# Patient Record
Sex: Female | Born: 1975 | Race: Black or African American | Hispanic: No | Marital: Single | Smoking: Former smoker
Health system: Southern US, Community
[De-identification: ages and names within clinical notes are randomized; demographics above are authoritative.]

## PROBLEM LIST (undated history)

## (undated) DIAGNOSIS — I10 Essential (primary) hypertension: Secondary | ICD-10-CM

## (undated) DIAGNOSIS — D649 Anemia, unspecified: Secondary | ICD-10-CM

## (undated) DIAGNOSIS — J020 Streptococcal pharyngitis: Secondary | ICD-10-CM

## (undated) HISTORY — DX: Essential (primary) hypertension: I10

## (undated) HISTORY — PX: APPENDECTOMY: SHX54

## (undated) HISTORY — PX: ABDOMINAL HYSTERECTOMY: SHX81

---

## 1997-10-27 ENCOUNTER — Emergency Department (HOSPITAL_COMMUNITY): Admission: EM | Admit: 1997-10-27 | Discharge: 1997-10-27 | Payer: Self-pay | Admitting: Internal Medicine

## 1997-12-23 ENCOUNTER — Emergency Department (HOSPITAL_COMMUNITY): Admission: EM | Admit: 1997-12-23 | Discharge: 1997-12-23 | Payer: Self-pay | Admitting: Emergency Medicine

## 1998-02-28 ENCOUNTER — Emergency Department (HOSPITAL_COMMUNITY): Admission: EM | Admit: 1998-02-28 | Discharge: 1998-02-28 | Payer: Self-pay | Admitting: Emergency Medicine

## 1998-02-28 ENCOUNTER — Encounter: Payer: Self-pay | Admitting: Emergency Medicine

## 1998-11-30 ENCOUNTER — Emergency Department (HOSPITAL_COMMUNITY): Admission: EM | Admit: 1998-11-30 | Discharge: 1998-11-30 | Payer: Self-pay | Admitting: Emergency Medicine

## 1999-04-07 ENCOUNTER — Emergency Department (HOSPITAL_COMMUNITY): Admission: EM | Admit: 1999-04-07 | Discharge: 1999-04-07 | Payer: Self-pay | Admitting: *Deleted

## 2000-01-11 ENCOUNTER — Emergency Department (HOSPITAL_COMMUNITY): Admission: EM | Admit: 2000-01-11 | Discharge: 2000-01-11 | Payer: Self-pay | Admitting: Emergency Medicine

## 2000-08-08 ENCOUNTER — Emergency Department (HOSPITAL_COMMUNITY): Admission: EM | Admit: 2000-08-08 | Discharge: 2000-08-08 | Payer: Self-pay | Admitting: *Deleted

## 2000-09-30 ENCOUNTER — Emergency Department (HOSPITAL_COMMUNITY): Admission: EM | Admit: 2000-09-30 | Discharge: 2000-09-30 | Payer: Self-pay | Admitting: Emergency Medicine

## 2000-10-10 ENCOUNTER — Emergency Department (HOSPITAL_COMMUNITY): Admission: EM | Admit: 2000-10-10 | Discharge: 2000-10-10 | Payer: Self-pay | Admitting: Emergency Medicine

## 2001-05-30 ENCOUNTER — Other Ambulatory Visit: Admission: RE | Admit: 2001-05-30 | Discharge: 2001-05-30 | Payer: Self-pay | Admitting: Obstetrics and Gynecology

## 2004-08-02 ENCOUNTER — Emergency Department (HOSPITAL_COMMUNITY): Admission: EM | Admit: 2004-08-02 | Discharge: 2004-08-02 | Payer: Self-pay | Admitting: Family Medicine

## 2009-05-06 ENCOUNTER — Emergency Department (HOSPITAL_COMMUNITY): Admission: EM | Admit: 2009-05-06 | Discharge: 2009-05-06 | Payer: Self-pay | Admitting: Emergency Medicine

## 2010-06-16 LAB — POCT RAPID STREP A (OFFICE): Streptococcus, Group A Screen (Direct): NEGATIVE

## 2010-10-15 ENCOUNTER — Inpatient Hospital Stay (INDEPENDENT_AMBULATORY_CARE_PROVIDER_SITE_OTHER)
Admission: RE | Admit: 2010-10-15 | Discharge: 2010-10-15 | Disposition: A | Discharge status: Home / Self Care | Payer: Self-pay | Source: Ambulatory Visit | Attending: Emergency Medicine | Admitting: Emergency Medicine

## 2010-10-15 ENCOUNTER — Ambulatory Visit (INDEPENDENT_AMBULATORY_CARE_PROVIDER_SITE_OTHER): Payer: Self-pay

## 2010-10-15 DIAGNOSIS — IMO0002 Reserved for concepts with insufficient information to code with codable children: Secondary | ICD-10-CM

## 2010-10-15 DIAGNOSIS — M799 Soft tissue disorder, unspecified: Secondary | ICD-10-CM

## 2011-05-26 ENCOUNTER — Encounter (HOSPITAL_COMMUNITY): Payer: Self-pay | Admitting: *Deleted

## 2011-05-26 ENCOUNTER — Emergency Department (INDEPENDENT_AMBULATORY_CARE_PROVIDER_SITE_OTHER)
Admission: EM | Admit: 2011-05-26 | Discharge: 2011-05-26 | Disposition: A | Discharge status: Home / Self Care | Payer: BC Managed Care – PPO | Source: Home / Self Care | Attending: Emergency Medicine | Admitting: Emergency Medicine

## 2011-05-26 DIAGNOSIS — J111 Influenza due to unidentified influenza virus with other respiratory manifestations: Secondary | ICD-10-CM

## 2011-05-26 MED ORDER — GUAIFENESIN-CODEINE 100-10 MG/5ML PO SYRP: 10.0000 mL | ORAL_SOLUTION | Freq: Four times a day (QID) | ORAL | Status: AC | PRN

## 2011-05-26 MED ORDER — OSELTAMIVIR PHOSPHATE 75 MG PO CAPS: 75.0000 mg | ORAL_CAPSULE | Freq: Two times a day (BID) | ORAL | Status: AC

## 2011-05-26 NOTE — ED Notes (Signed)
C/o cough that causes chest to hurt. C/o nasal/chest congestion, sore throat since Tuesday 2/26

## 2011-05-26 NOTE — Discharge Instructions (Signed)
Influenza Facts Flu (influenza) is a contagious respiratory illness caused by the influenza viruses. It can cause mild to severe illness. While most healthy people recover from the flu without specific treatment and without complications, older people, young children, and people with certain health conditions are at higher risk for serious complications from the flu, including death. CAUSES   The flu virus is spread from person to person by respiratory droplets from coughing and sneezing.   A person can also become infected by touching an object or surface with a virus on it and then touching their mouth, eye or nose.   Adults may be able to infect others from 1 day before symptoms occur and up to 7 days after getting sick. So it is possible to give someone the flu even before you know you are sick and continue to infect others while you are sick.  SYMPTOMS   Fever (usually high).   Headache.   Tiredness (can be extreme).   Cough.   Sore throat.   Runny or stuffy nose.   Body aches.   Diarrhea and vomiting may also occur, particularly in children.   These symptoms are referred to as "flu-like symptoms". A lot of different illnesses, including the common cold, can have similar symptoms.  DIAGNOSIS   There are tests that can determine if you have the flu as long you are tested within the first 2 or 3 days of illness.   A doctor's exam and additional tests may be needed to identify if you have a disease that is a complicating the flu.  RISKS AND COMPLICATIONS  Some of the complications caused by the flu include:  Bacterial pneumonia or progressive pneumonia caused by the flu virus.   Loss of body fluids (dehydration).   Worsening of chronic medical conditions, such as heart failure, asthma, or diabetes.   Sinus problems and ear infections.  HOME CARE INSTRUCTIONS   Seek medical care early on.   If you are at high risk from complications of the flu, consult your health-care  provider as soon as you develop flu-like symptoms. Those at high risk for complications include:   People 65 years or older.   People with chronic medical conditions, including diabetes.   Pregnant women.   Young children.   Your caregiver may recommend use of an antiviral medication to help treat the flu.   If you get the flu, get plenty of rest, drink a lot of liquids, and avoid using alcohol and tobacco.   You can take over-the-counter medications to relieve the symptoms of the flu if your caregiver approves. (Never give aspirin to children or teenagers who have flu-like symptoms, particularly fever).  PREVENTION  The single best way to prevent the flu is to get a flu vaccine each fall. Other measures that can help protect against the flu are:  Antiviral Medications   A number of antiviral drugs are approved for use in preventing the flu. These are prescription medications, and a doctor should be consulted before they are used.   Habits for Good Health   Cover your nose and mouth with a tissue when you cough or sneeze, throw the tissue away after you use it.   Wash your hands often with soap and water, especially after you cough or sneeze. If you are not near water, use an alcohol-based hand cleaner.   Avoid people who are sick.   If you get the flu, stay home from work or school. Avoid contact with   other people so that you do not make them sick, too.   Try not to touch your eyes, nose, or mouth as germs ore often spread this way.  IN CHILDREN, EMERGENCY WARNING SIGNS THAT NEED URGENT MEDICAL ATTENTION:  Fast breathing or trouble breathing.   Bluish skin color.   Not drinking enough fluids.   Not waking up or not interacting.   Being so irritable that the child does not want to be held.   Flu-like symptoms improve but then return with fever and worse cough.   Fever with a rash.  IN ADULTS, EMERGENCY WARNING SIGNS THAT NEED URGENT MEDICAL ATTENTION:  Difficulty  breathing or shortness of breath.   Pain or pressure in the chest or abdomen.   Sudden dizziness.   Confusion.   Severe or persistent vomiting.  SEEK IMMEDIATE MEDICAL CARE IF:  You or someone you know is experiencing any of the symptoms above. When you arrive at the emergency center,report that you think you have the flu. You may be asked to wear a mask and/or sit in a secluded area to protect others from getting sick. MAKE SURE YOU:   Understand these instructions.   Monitor your condition.   Seek medical care if you are getting worse, or not improving.  Document Released: 03/17/2003 Document Revised: 11/24/2010 Document Reviewed: 12/11/2008 Exit[employer] Patient Information 2012 [employer].   Most upper respiratory infections are caused by viruses and do not require antibiotics.  We try to save the antibiotics for when we really need them to avoid resistance.  This does not mean that there is nothing that can be done.  Here are a few hints about things that can be done at home to get over an upper respiratory infection quicker:  Get extra sleep and extra fluids.  Get 7 to 9 hours of sleep per night and 6 to 8 glasses of water a day.  Getting extra sleep keeps the immune system from getting run down.  Most people with an upper respiratory infection are a little dehydrated.  The extra fluids also keep the secretions liquified and easier to deal with.  Also, get extra vitamin C.  4000 mg per day is the recommended dose. For the aches, headache, and fever, acetaminophen or ibuprofen are helpful.  These can be alternated every 4 hours.  People with liver disease should avoid large amounts of acetaminophen, and people with ulcer disease, gastroesophageal reflux, gastritis, congestive heart failure, chronic kidney disease, coronary artery disease and the elderly should avoid ibuprofen. For nasal congestion try Mucinex-D, or if you're having lots of sneezing or copious clear nasal drainage  Allegra-D-24 hour.  A Saline nasal spray such as Ocean Spray can also help as can decongestant sprays such as Afrin, but you should not use the decongestant sprays for more than 3 or 4 days since they can be habituating.  If nasal dryness is a problem, Ayr Nasal Gel can help moisturize your nasal passages.  Breath Rite nasal strips can also offer a non-drug alternative treatment to nasal congestion, especially at night. For people with symptoms of sinusitis, sleeping with your head elevated can be helpful.  For sinus pain, moist, hot compresses to the face may provide some relief.  Many people find that inhaling steam as in a shower or from a pot of steaming water can help. For sore throat, zinc containing lozenges such as Cold-Eze or Zicam are helpful.  Zinc helps to fight infection and has a mild astringent effect that   relieves the sore, achey throat.  Hot salt water gargles (8 oz of hot water, 1/2 tsp of table salt, and a pinch of baking soda) can give relief as well as hot beverages such as hot tea. For the cough, old time remedies such as honey or honey and lemon are tried and true.  Over the counter cough syrups such as Delsym 2 tsp every 12 hours can help as well.  It's important when you have an upper respiratory infection not to pass the infection to others.  This involves being very careful about the following:  Frequent hand washing or use of hand sanitizer, especially after coughing, sneezing, blowing your nose or touching your face, nose or eyes. Do not shake hands or touch anyone and try to avoid touching surfaces that other people use such as doorknobs, shopping carts, telephones and computer keyboards. Use tissues and dispose of them properly in a garbage can or ziplock bag. Cough into your sleeve. Do not let others eat or drink after you.  It's also important to recognize the signs of serious illness and get evaluated if they occur: Any respiratory infection that lasts more than 7 to  10 days.  Yellow nasal drainage and sputum are not reliable indicators of a bacterial infection, but if they last for more than 1 week, see your doctor. Fever and sore throat can indicate strep. Fever and cough can indicate influenza or pneumonia. Any kind of severe symptom such as difficulty breathing, intractable vomiting, or severe pain should prompt you to see a doctor as soon as possible.   Your body's immune system is really the thing that will get rid of this infection.  Your immune system is comprised of 2 types of specialized cells called T cells and B cells.  T cells coordinate the array of cells in your body that engulf invading bacteria or viruses while B cells orchestrate the production of antibodies that neutralize infection.  Anything we do or any medications we give you, will just strengthen your immune system or help it clear up the infection quicker.  Here are a few helpful hints to improve your immune system to help overcome this illness or to prevent future infections:  A few vitamins can improve the health of your immune system.  That's why your diet should include plenty of fruits, vegetables, fish, nuts, and whole grains.  Vitamin A and bet-carotene can increase the cells that fight infections (T cells and B cells).  Vitamin A is abundant in dark greens and orange vegetables such as spinach, greens, sweet potatoes, and carrots.  Vitamin B6 contributes to the maturation of white blood cells, the cells that fight disease.  Foods with vitamin B6 include cold cereal and bananas.  Vitamin C is credited with preventing colds because it increases white blood cells and also prevents cellular damage.  Citrus fruits, peaches and green and red bell peppers are all hight in vitamin C.  Vitamin E is an anti-oxidant that encourages the production of natural killer cells which reject foreign invaders and B cells that produce antibodies.  Foods high in vitamin E include wheat germ, nuts and  seeds.  Foods high in omega-3 fatty acids found in foods like salmon, tuna and mackerel boost your immune system and help cells to engulf and absorb germs.  Probiotics are good bacteria that increase your T cells.  These can be found in yogurt and are available in supplements such as Culturelle or Align.  Moderate exercise increases the   strength of your immune system and your ability to recover from illness.  I suggest 3 to 5 moderate intensity 30 minute workouts per week.    Sleep is another component of maintaining a strong immune system.  It enables your body to recuperate from the day's activities, stress and work.  My recommendation is to get between 7 and 9 hours of sleep per night.  If you smoke, try to quit completely or at least cut down.  Drink alcohol only in moderation if at all.  No more than 2 drinks daily for men or 1 for women.  Get a flu vaccine early in the fall or if you have not gotten one yet, once this illness has run its course.  If you are over 65, a smoker, or an asthmatic, get a pneumococcal vaccine.  My final recommendation is to maintain a healthy weight.  Excess weight can impair the immune system by interfering with the way the immune system deals with invading viruses or bacteria.   

## 2011-05-26 NOTE — ED Provider Notes (Signed)
Chief Complaint  Patient presents with  . Cough    History of Present Illness:   The patient has had a three-day history of sore throat, cough productive of small amounts of clear sputum, chest tightness, nasal congestion, rhinorrhea, sneezing, headache, fever up to 101, chills, sweats, muscle aches, and aching in the chest when she coughs. 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 She has had a flu vaccine this year.  Review of Systems:  Other than noted above, the patient denies any of the following symptoms. Systemic:  No fever, chills, sweats, fatigue, myalgias, headache, or anorexia. Eye:  No redness, pain or drainage. ENT:  No earache, nasal congestion, rhinorrhea, sinus pressure, or sore throat. Lungs:  No cough, sputum production, wheezing, shortness of breath. Or chest pain. GI:  No nausea, vomiting, abdominal pain or diarrhea. Skin:  No rash or itching.  PMFSH:  Past medical history, family history, social history, meds, and allergies were reviewed.  Physical Exam:   Vital signs:  BP 140/91  Pulse 108  Temp(Src) 98.4 F (36.9 C) (Oral)  Resp 16  SpO2 98%  LMP 05/14/2011 General:  Alert, in no distress. Eye:  No conjunctival injection or drainage. ENT:  TMs and canals were normal, without erythema or inflammation.  Nasal mucosa was clear and uncongested, without drainage.  Mucous membranes were moist.  Pharynx was clear, without exudate or drainage.  There were no oral ulcerations or lesions. Neck:  Supple, no adenopathy, tenderness or mass. Lungs:  No respiratory distress.  Lungs were clear to auscultation, without wheezes, rales or rhonchi.  Breath sounds were clear and equal bilaterally. Heart:  Regular rhythm, without gallops, murmers or rubs. Skin:  Clear, warm, and dry, without rash or lesions.  Assessment:   Diagnoses that have been ruled out:  None  Diagnoses that are still under consideration:  None  Final diagnoses:  Influenza-like illness     Plan:   1.  The following meds were prescribed:   New Prescriptions   GUAIFENESIN-CODEINE (GUIATUSS AC) 100-10 MG/5ML SYRUP    Take 10 mLs by mouth 4 (four) times daily as needed for cough.   OSELTAMIVIR (TAMIFLU) 75 MG CAPSULE    Take 1 capsule (75 mg total) by mouth every 12 (twelve) hours.   2.  The patient was instructed in symptomatic care and handouts were given. 3.  The patient was told to return if becoming worse in any way, if no better in 3 or 4 days, and given some red flag symptoms that would indicate earlier return.   Roque Lias, MD 05/26/11 [phone]

## 2013-03-01 ENCOUNTER — Encounter (HOSPITAL_COMMUNITY): Payer: Self-pay | Admitting: Emergency Medicine

## 2013-03-01 ENCOUNTER — Emergency Department (HOSPITAL_COMMUNITY)
Admission: EM | Admit: 2013-03-01 | Discharge: 2013-03-01 | Disposition: A | Discharge status: Home / Self Care | Payer: BC Managed Care – PPO | Source: Home / Self Care | Attending: Family Medicine | Admitting: Family Medicine

## 2013-03-01 DIAGNOSIS — J111 Influenza due to unidentified influenza virus with other respiratory manifestations: Secondary | ICD-10-CM

## 2013-03-01 NOTE — ED Notes (Signed)
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

## 2013-03-01 NOTE — ED Provider Notes (Signed)
CSN: [id number]     Arrival date & time 03/01/13  1232 History   First MD Initiated Contact with Patient 03/01/13 1324     Chief Complaint  Patient presents with  . Fever   (Consider location/radiation/quality/duration/timing/severity/associated sxs/prior Treatment) Patient is a 37 y.o. female presenting with fever. The history is provided by the patient.  Fever Temp source:  Oral Severity:  Moderate Onset quality:  Sudden Duration:  1 day Progression:  Unchanged Chronicity:  New Worsened by:  Nothing tried Ineffective treatments:  Acetaminophen and ibuprofen Associated symptoms: chills, cough, myalgias and rhinorrhea   Associated symptoms: no diarrhea, no dysuria, no nausea and no vomiting   Risk factors: sick contacts     History reviewed. No pertinent past medical history. History reviewed. No pertinent past surgical history. Family History  Problem Relation Age of Onset  . Diabetes Mother    History  Substance Use Topics  . Smoking status: Never Smoker   . Smokeless tobacco: Not on file  . Alcohol Use: Yes   OB History   Grav Para Term Preterm Abortions TAB SAB Ect Mult Living   1 1 1             Review of Systems  Constitutional: Positive for fever and chills.  HENT: Positive for rhinorrhea.   Respiratory: Positive for cough.   Gastrointestinal: Negative.  Negative for nausea, vomiting and diarrhea.  Genitourinary: Negative.  Negative for dysuria.  Musculoskeletal: Positive for myalgias.    Allergies  Review of patient's allergies indicates no known allergies.  Home Medications  No current outpatient prescriptions on file. BP 136/90  Pulse 103  Temp(Src) 100.9 F (38.3 C) (Oral)  Resp 16  SpO2 97% Physical Exam  Nursing note and vitals reviewed. Constitutional: She is oriented to person, place, and time. She appears well-developed and well-nourished.  HENT:  Head: Normocephalic.  Right Ear: External ear normal.  Left Ear: External ear normal.   Mouth/Throat: Oropharynx is clear and moist.  Eyes: Pupils are equal, round, and reactive to light.  Neck: Normal range of motion. Neck supple.  Cardiovascular: Regular rhythm and normal heart sounds.   Pulmonary/Chest: Breath sounds normal.  Abdominal: Bowel sounds are normal. There is no tenderness.  Lymphadenopathy:    She has no cervical adenopathy.  Neurological: She is alert and oriented to person, place, and time.  Skin: Skin is warm and dry.    ED Course  Procedures (including critical care time) Labs Review Labs Reviewed - No data to display Imaging Review No results found.  EKG Interpretation    Date/Time:    Ventricular Rate:    PR Interval:    QRS Duration:   QT Interval:    QTC Calculation:   R Axis:     Text Interpretation:              MDM      Linna Hoff, MD 03/01/13 1406

## 2014-01-27 ENCOUNTER — Encounter (HOSPITAL_COMMUNITY): Payer: Self-pay | Admitting: Emergency Medicine

## 2014-08-03 ENCOUNTER — Emergency Department (HOSPITAL_COMMUNITY)
Admission: EM | Admit: 2014-08-03 | Discharge: 2014-08-03 | Disposition: A | Discharge status: Home / Self Care | Payer: 59 | Attending: Emergency Medicine | Admitting: Emergency Medicine

## 2014-08-03 ENCOUNTER — Encounter (HOSPITAL_COMMUNITY): Payer: Self-pay | Admitting: Emergency Medicine

## 2014-08-03 DIAGNOSIS — R0981 Nasal congestion: Secondary | ICD-10-CM | POA: Diagnosis present

## 2014-08-03 DIAGNOSIS — J029 Acute pharyngitis, unspecified: Secondary | ICD-10-CM

## 2014-08-03 DIAGNOSIS — J069 Acute upper respiratory infection, unspecified: Secondary | ICD-10-CM | POA: Diagnosis not present

## 2014-08-03 DIAGNOSIS — R590 Localized enlarged lymph nodes: Secondary | ICD-10-CM | POA: Insufficient documentation

## 2014-08-03 DIAGNOSIS — J302 Other seasonal allergic rhinitis: Secondary | ICD-10-CM | POA: Insufficient documentation

## 2014-08-03 HISTORY — DX: Streptococcal pharyngitis: J02.0

## 2014-08-03 LAB — RAPID STREP SCREEN (MED CTR MEBANE ONLY): Streptococcus, Group A Screen (Direct): NEGATIVE

## 2014-08-03 MED ORDER — PHENOL 1.4 % MT LIQD
1.0000 | OROMUCOSAL | Status: DC | PRN
  Administered 2014-08-03: 1 via OROMUCOSAL
  Filled 2014-08-03: qty 177

## 2014-08-03 NOTE — ED Provider Notes (Signed)
CSN: [id number]     Arrival date & time 08/03/14  1129 History  This chart was scribed for [employer] PA-C working with Serita Grit, MD by Mercy Moore, ED Scribe. This patient was seen in room TR08C/TR08C and the patient's care was started at 11:52 AM.   No chief complaint on file.  Patient is a 39 y.o. female presenting with pharyngitis. The history is provided by the patient. No language interpreter was used.  Sore Throat This is a new problem. The current episode started 3 to 5 hours ago. The problem occurs constantly. The problem has been gradually worsening. Pertinent negatives include no chest pain, no abdominal pain, no headaches and no shortness of breath. The symptoms are aggravated by swallowing. Nothing relieves the symptoms. She has tried nothing for the symptoms. The treatment provided no relief.   HPI Comments: Toni Valencia is a 39 y.o. female who presents to the Emergency Department complaining of 8/10, constant nonradiating sore throat, onset this morning. Patient reports worsening sore throat with spitting and swallowing. 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 No known alleviating factors give that she has not tried anything PTA. Associated symptoms include dry cough, nasal congestion, and clear rhinorrhea. Patient denies difficulty swallowing, trismus, drooling, neck pain/swelling, arthralgias, myalgias, fever, chills, ear pain or drainage, CP, SOB, wheezing, abdominal pain, nausea/vomiting, diarrhea, weakness, tingling or numbness. ?Environmental allergies. No other medical issues.  No past medical history on file. No past surgical history on file. Family History  Problem Relation Age of Onset  . Diabetes Mother    History  Substance Use Topics  . Smoking status: Never Smoker   . Smokeless tobacco: Not on file  . Alcohol Use: Yes   OB History    Gravida Para Term Preterm AB TAB SAB Ectopic Multiple Living   1 1 1              Review of Systems  Constitutional: Negative for fever and chills.  HENT: Positive for congestion, rhinorrhea, sinus pressure and sore throat. Negative for drooling, ear discharge, ear pain and trouble swallowing.   Eyes: Negative for visual disturbance.  Respiratory: Positive for cough. Negative for shortness of breath and wheezing.   Cardiovascular: Negative for chest pain.  Gastrointestinal: Negative for nausea, vomiting, abdominal pain and diarrhea.  Genitourinary: Negative for dysuria and hematuria.  Musculoskeletal: Negative for myalgias, arthralgias and neck pain.  Skin: Negative for rash.  Allergic/Immunologic: Positive for environmental allergies (she thinks). Negative for immunocompromised state.  Neurological: Negative for weakness, numbness and headaches.  A complete 10 system review of systems was obtained and all systems are negative except as noted in the HPI and PMH.    Allergies  Review of patient's allergies indicates no known allergies.  Home Medications   Prior to Admission medications   Not on File   Triage Vitals:  BP 128/80 mmHg  Pulse 96  Temp(Src) 99 F (37.2 C) (Oral)  Resp 14  Ht 5\' 9"  (1.753 m)  Wt 150 lb (68.04 kg)  BMI 22.14 kg/m2  SpO2 100%  LMP 07/20/2014 Physical Exam  Constitutional: She is oriented to person, place, and time. Vital signs are normal. She appears well-developed and well-nourished.  Non-toxic appearance. No distress.  Afebrile, nontoxic, NAD  HENT:  Head: Normocephalic and atraumatic.  Right Ear: Hearing, tympanic membrane, external ear and ear canal normal.  Left Ear: Hearing, tympanic membrane, external ear and ear canal normal.  Nose: Mucosal edema  and rhinorrhea present.  Mouth/Throat: Uvula is midline and mucous membranes are normal. No trismus in the jaw. No uvula swelling. Posterior oropharyngeal erythema present. No oropharyngeal exudate, posterior oropharyngeal edema or tonsillar abscesses.  Left nostril  mucosal edema. Tender lymphadenopathy cervical and tonsillar bilaterally. Ears clear bilaterally. Mild bilateral nasal mucosal edema with L>R, and clear rhinorrhea. Uvula midline with mild erythema, but no swelling or deviation. No trismus or drooling. No tonsillar swelling or exudates. Mild oropharyngeal erythema.   Eyes: Conjunctivae and EOM are normal. Right eye exhibits no discharge. Left eye exhibits no discharge.  Neck: Normal range of motion. Neck supple.  Cardiovascular: Normal rate, regular rhythm, normal heart sounds and intact distal pulses.  Exam reveals no gallop and no friction rub.   No murmur heard. Pulmonary/Chest: Effort normal and breath sounds normal. No respiratory distress. She has no decreased breath sounds. She has no wheezes. She has no rhonchi. She has no rales.  CTAB in all lung fields, no w/r/r, no hypoxia or increased WOB, speaking in full sentences, SpO2 100% on RA   Abdominal: Soft. Normal appearance and bowel sounds are normal. She exhibits no distension. There is no tenderness. There is no rigidity, no rebound and no guarding.  Musculoskeletal: Normal range of motion.  Lymphadenopathy:       Head (right side): Tonsillar adenopathy present. No submandibular adenopathy present.       Head (left side): Tonsillar adenopathy present. No submandibular adenopathy present.    She has cervical adenopathy.  Shotty cervical LAD bilaterally and tonsillar LAD bilaterally with mild TTP.   Neurological: She is alert and oriented to person, place, and time. She has normal strength. No sensory deficit.  Skin: Skin is warm, dry and intact. No rash noted.  Psychiatric: She has a normal mood and affect. Her behavior is normal.  Nursing note and vitals reviewed.   ED Course  Procedures (including critical care time)  COORDINATION OF CARE: 11:35 AM- Discussed treatment plan with patient at bedside and patient agreed to plan.   Labs Review Labs Reviewed  RAPID STREP SCREEN   CULTURE, GROUP A STREP    Imaging Review No results found.   EKG Interpretation None      MDM   Final diagnoses:  Sore throat  URI (upper respiratory infection)    39 y.o. female here with sore throat and URI symptoms. Clear lung exam. +sick contacts. Although unlikely since CENTOR criteria is low, will obtain RST. Will give chloraseptic spray here for relief. Will reassess shortly  12:59 PM RST neg. Will have culture results dictate any further needs for treatment. Discussed symptomatic control and f/up with PCP (calling ins company for this information) in 1wk. Strict return precautions given. I explained the diagnosis and have given explicit precautions to return to the ER including for any other new or worsening symptoms. The patient understands and accepts the medical plan as it's been dictated and I have answered their questions. Discharge instructions concerning home care and prescriptions have been given. The patient is STABLE and is discharged to home in good condition.   I personally performed the services described in this documentation, which was scribed in my presence. The recorded information has been reviewed and is accurate.  BP 128/80 mmHg  Pulse 96  Temp(Src) 99 F (37.2 C) (Oral)  Resp 14  Ht 5\' 9"  (1.753 m)  Wt 150 lb (68.04 kg)  BMI 22.14 kg/m2  SpO2 100%  LMP 07/20/2014  Meds ordered this encounter  Medications  . phenol (CHLORASEPTIC) mouth spray 1 spray    Sig:       Marshelle Bilger Camprubi-Soms, PA-C 08/03/14 1303  Lucresia Simic Camprubi-Soms, PA-C 08/03/14 [place], MD 08/07/14 [phone]

## 2014-08-03 NOTE — Discharge Instructions (Signed)
Continue to stay well-hydrated. Gargle warm salt water and spit it out. Use chloraseptic spray for throat pain. Continue to alternate between Tylenol and Ibuprofen for pain or fever. Use Mucinex for cough suppression/expectoration of mucus. Use netipot and flonase to help with nasal congestion. May consider over-the-counter Benadryl or other antihistamine to decrease secretions and for watery itchy eyes. Followup with your primary care doctor in 5-7 days for recheck of ongoing symptoms. Return to emergency department for emergent changing or worsening of symptoms.   Cough, Adult  A cough is a reflex that helps clear your throat and airways. It can help heal the body or may be a reaction to an irritated airway. A cough may only last 2 or 3 weeks (acute) or may last more than 8 weeks (chronic).  CAUSES Acute cough:  Viral or bacterial infections. Chronic cough:  Infections.  Allergies.  Asthma.  Post-nasal drip.  Smoking.  Heartburn or acid reflux.  Some medicines.  Chronic lung problems (COPD).  Cancer. SYMPTOMS   Cough.  Fever.  Chest pain.  Increased breathing rate.  High-pitched whistling sound when breathing (wheezing).  Colored mucus that you cough up (sputum). TREATMENT   A bacterial cough may be treated with antibiotic medicine.  A viral cough must run its course and will not respond to antibiotics.  Your caregiver may recommend other treatments if you have a chronic cough. HOME CARE INSTRUCTIONS   Only take over-the-counter or prescription medicines for pain, discomfort, or fever as directed by your caregiver. Use cough suppressants only as directed by your caregiver.  Use a cold steam vaporizer or humidifier in your bedroom or home to help loosen secretions.  Sleep in a semi-upright position if your cough is worse at night.  Rest as needed.  Stop smoking if you smoke. SEEK IMMEDIATE MEDICAL CARE IF:   You have pus in your sputum.  Your cough  starts to worsen.  You cannot control your cough with suppressants and are losing sleep.  You begin coughing up blood.  You have difficulty breathing.  You develop pain which is getting worse or is uncontrolled with medicine.  You have a fever. MAKE SURE YOU:   Understand these instructions.  Will watch your condition.  Will get help right away if you are not doing well or get worse. Document Released: 09/10/2010 Document Revised: 06/06/2011 Document Reviewed: 09/10/2010 [facility] Patient Information 2015 [place], [place]. This information is not intended to replace advice given to you by your health care provider. Make sure you discuss any questions you have with your health care provider.  Pharyngitis Pharyngitis is a sore throat (pharynx). There is redness, pain, and swelling of your throat. HOME CARE   Drink enough fluids to keep your pee (urine) clear or pale yellow.  Only take medicine as told by your doctor.  You may get sick again if you do not take medicine as told. Finish your medicines, even if you start to feel better.  Do not take aspirin.  Rest.  Rinse your mouth (gargle) with salt water ( tsp of salt per 1 qt of water) every 1-2 hours. This will help the pain.  If you are not at risk for choking, you can suck on hard candy or sore throat lozenges. GET HELP IF:  You have large, tender lumps on your neck.  You have a rash.  You cough up green, yellow-brown, or bloody spit. GET HELP RIGHT AWAY IF:   You have a stiff neck.  You drool  or cannot swallow liquids.  You throw up (vomit) or are not able to keep medicine or liquids down.  You have very bad pain that does not go away with medicine.  You have problems breathing (not from a stuffy nose). MAKE SURE YOU:   Understand these instructions.  Will watch your condition.  Will get help right away if you are not doing well or get worse. Document Released: 08/31/2007 Document Revised: 01/02/2013  Document Reviewed: 11/19/2012 [facility] Patient Information 2015 [place], [place]. This information is not intended to replace advice given to you by your health care provider. Make sure you discuss any questions you have with your health care provider.  Salt Water Gargle This solution will help make your mouth and throat feel better. HOME CARE INSTRUCTIONS   Mix 1 teaspoon of salt in 8 ounces of warm water.  Gargle with this solution as much or often as you need or as directed. Swish and gargle gently if you have any sores or wounds in your mouth.  Do not swallow this mixture. Document Released: 12/17/2003 Document Revised: 06/06/2011 Document Reviewed: 05/09/2008 [facility] Patient Information 2015 [place], [place]. This information is not intended to replace advice given to you by your health care provider. Make sure you discuss any questions you have with your health care provider.  Upper Respiratory Infection, Adult An upper respiratory infection (URI) is also sometimes known as the common cold. The upper respiratory tract includes the nose, sinuses, throat, trachea, and bronchi. Bronchi are the airways leading to the lungs. Most people improve within 1 week, but symptoms can last up to 2 weeks. A residual cough may last even longer.  CAUSES Many different viruses can infect the tissues lining the upper respiratory tract. The tissues become irritated and inflamed and often become very moist. Mucus production is also common. A cold is contagious. You can easily spread the virus to others by oral contact. This includes kissing, sharing a glass, coughing, or sneezing. Touching your mouth or nose and then touching a surface, which is then touched by another person, can also spread the virus. SYMPTOMS  Symptoms typically develop 1 to 3 days after you come in contact with a cold virus. Symptoms vary from person to person. They may include:  Runny nose.  Sneezing.  Nasal congestion.  Sinus  irritation.  Sore throat.  Loss of voice (laryngitis).  Cough.  Fatigue.  Muscle aches.  Loss of appetite.  Headache.  Low-grade fever. DIAGNOSIS  You might diagnose your own cold based on familiar symptoms, since most people get a cold 2 to 3 times a year. Your caregiver can confirm this based on your exam. Most importantly, your caregiver can check that your symptoms are not due to another disease such as strep throat, sinusitis, pneumonia, asthma, or epiglottitis. Blood tests, throat tests, and X-rays are not necessary to diagnose a common cold, but they may sometimes be helpful in excluding other more serious diseases. Your caregiver will decide if any further tests are required. RISKS AND COMPLICATIONS  You may be at risk for a more severe case of the common cold if you smoke cigarettes, have chronic heart disease (such as heart failure) or lung disease (such as asthma), or if you have a weakened immune system. The very young and very old are also at risk for more serious infections. Bacterial sinusitis, middle ear infections, and bacterial pneumonia can complicate the common cold. The common cold can worsen asthma and chronic obstructive pulmonary disease (COPD). Sometimes,  these complications can require emergency medical care and may be life-threatening. PREVENTION  The best way to protect against getting a cold is to practice good hygiene. Avoid oral or hand contact with people with cold symptoms. Wash your hands often if contact occurs. There is no clear evidence that vitamin C, vitamin E, echinacea, or exercise reduces the chance of developing a cold. However, it is always recommended to get plenty of rest and practice good nutrition. TREATMENT  Treatment is directed at relieving symptoms. There is no cure. Antibiotics are not effective, because the infection is caused by a virus, not by bacteria. Treatment may include:  Increased fluid intake. Sports drinks offer valuable  electrolytes, sugars, and fluids.  Breathing heated mist or steam (vaporizer or shower).  Eating chicken soup or other clear broths, and maintaining good nutrition.  Getting plenty of rest.  Using gargles or lozenges for comfort.  Controlling fevers with ibuprofen or acetaminophen as directed by your caregiver.  Increasing usage of your inhaler if you have asthma. Zinc gel and zinc lozenges, taken in the first 24 hours of the common cold, can shorten the duration and lessen the severity of symptoms. Pain medicines may help with fever, muscle aches, and throat pain. A variety of non-prescription medicines are available to treat congestion and runny nose. Your caregiver can make recommendations and may suggest nasal or lung inhalers for other symptoms.  HOME CARE INSTRUCTIONS   Only take over-the-counter or prescription medicines for pain, discomfort, or fever as directed by your caregiver.  Use a warm mist humidifier or inhale steam from a shower to increase air moisture. This may keep secretions moist and make it easier to breathe.  Drink enough water and fluids to keep your urine clear or pale yellow.  Rest as needed.  Return to work when your temperature has returned to normal or as your caregiver advises. You may need to stay home longer to avoid infecting others. You can also use a face mask and careful hand washing to prevent spread of the virus. SEEK MEDICAL CARE IF:   After the first few days, you feel you are getting worse rather than better.  You need your caregiver's advice about medicines to control symptoms.  You develop chills, worsening shortness of breath, or brown or red sputum. These may be signs of pneumonia.  You develop yellow or brown nasal discharge or pain in the face, especially when you bend forward. These may be signs of sinusitis.  You develop a fever, swollen neck glands, pain with swallowing, or white areas in the back of your throat. These may be signs  of strep throat. SEEK IMMEDIATE MEDICAL CARE IF:   You have a fever.  You develop severe or persistent headache, ear pain, sinus pain, or chest pain.  You develop wheezing, a prolonged cough, cough up blood, or have a change in your usual mucus (if you have chronic lung disease).  You develop sore muscles or a stiff neck. Document Released: 09/07/2000 Document Revised: 06/06/2011 Document Reviewed: 06/19/2013 [facility] Patient Information 2015 [place], [place]. This information is not intended to replace advice given to you by your health care provider. Make sure you discuss any questions you have with your health care provider.

## 2014-08-03 NOTE — ED Notes (Signed)
Pt c/o nasal congestion onset Friday and sore throat onset today. Pt has not taken any meds today for symptoms.

## 2014-08-07 LAB — CULTURE, GROUP A STREP: Strep A Culture: NEGATIVE

## 2015-02-20 ENCOUNTER — Encounter (HOSPITAL_COMMUNITY): Payer: Self-pay

## 2015-02-20 ENCOUNTER — Emergency Department (HOSPITAL_COMMUNITY)
Admission: EM | Admit: 2015-02-20 | Discharge: 2015-02-20 | Disposition: A | Discharge status: Home / Self Care | Payer: 59 | Attending: Emergency Medicine | Admitting: Emergency Medicine

## 2015-02-20 DIAGNOSIS — N8189 Other female genital prolapse: Secondary | ICD-10-CM | POA: Insufficient documentation

## 2015-02-20 DIAGNOSIS — R339 Retention of urine, unspecified: Secondary | ICD-10-CM | POA: Insufficient documentation

## 2015-02-20 DIAGNOSIS — Z3202 Encounter for pregnancy test, result negative: Secondary | ICD-10-CM | POA: Diagnosis not present

## 2015-02-20 DIAGNOSIS — R300 Dysuria: Secondary | ICD-10-CM | POA: Diagnosis present

## 2015-02-20 DIAGNOSIS — Z79899 Other long term (current) drug therapy: Secondary | ICD-10-CM | POA: Diagnosis not present

## 2015-02-20 DIAGNOSIS — N819 Female genital prolapse, unspecified: Secondary | ICD-10-CM

## 2015-02-20 DIAGNOSIS — D509 Iron deficiency anemia, unspecified: Secondary | ICD-10-CM | POA: Diagnosis not present

## 2015-02-20 LAB — CBC WITH DIFFERENTIAL/PLATELET
BASOS ABS: 0 10*3/uL (ref 0.0–0.1)
BASOS PCT: 0 %
Eosinophils Absolute: 0 10*3/uL (ref 0.0–0.7)
Eosinophils Relative: 0 %
HCT: 23.7 % — ABNORMAL LOW (ref 36.0–46.0)
HEMOGLOBIN: 6.6 g/dL — AB (ref 12.0–15.0)
LYMPHS ABS: 0.7 10*3/uL (ref 0.7–4.0)
Lymphocytes Relative: 6 %
MCH: 18.6 pg — AB (ref 26.0–34.0)
MCHC: 27.8 g/dL — AB (ref 30.0–36.0)
MCV: 66.8 fL — ABNORMAL LOW (ref 78.0–100.0)
MONO ABS: 0.3 10*3/uL (ref 0.1–1.0)
Monocytes Relative: 3 %
NEUTROS ABS: 10.2 10*3/uL — AB (ref 1.7–7.7)
Neutrophils Relative %: 91 %
PLATELETS: 485 10*3/uL — AB (ref 150–400)
RBC: 3.55 MIL/uL — ABNORMAL LOW (ref 3.87–5.11)
RDW: 18.4 % — AB (ref 11.5–15.5)
WBC: 11.2 10*3/uL — ABNORMAL HIGH (ref 4.0–10.5)

## 2015-02-20 LAB — BASIC METABOLIC PANEL
Anion gap: 10 (ref 5–15)
BUN: 14 mg/dL (ref 6–20)
CALCIUM: 9.5 mg/dL (ref 8.9–10.3)
CHLORIDE: 107 mmol/L (ref 101–111)
CO2: 21 mmol/L — ABNORMAL LOW (ref 22–32)
CREATININE: 0.79 mg/dL (ref 0.44–1.00)
GFR calc Af Amer: >60 mL/min (ref >60)
GFR calc non Af Amer: >60 mL/min (ref >60)
Glucose, Bld: 82 mg/dL (ref 65–99)
Potassium: 4 mmol/L (ref 3.5–5.1)
SODIUM: 138 mmol/L (ref 135–145)

## 2015-02-20 LAB — URINALYSIS, ROUTINE W REFLEX MICROSCOPIC
Bilirubin Urine: NEGATIVE
Glucose, UA: NEGATIVE mg/dL
Hgb urine dipstick: NEGATIVE
KETONES UR: NEGATIVE mg/dL
LEUKOCYTES UA: NEGATIVE
Nitrite: NEGATIVE
Protein, ur: NEGATIVE mg/dL
Specific Gravity, Urine: 1.016 (ref 1.005–1.030)
pH: 7 (ref 5.0–8.0)

## 2015-02-20 LAB — PREGNANCY, URINE: PREG TEST UR: NEGATIVE

## 2015-02-20 LAB — TYPE AND SCREEN
ABO/RH(D): O POS
Antibody Screen: NEGATIVE

## 2015-02-20 LAB — I-STAT CHEM 8, ED
BUN: 11 mg/dL (ref 6–20)
CALCIUM ION: 1.24 mmol/L — AB (ref 1.12–1.23)
CREATININE: 0.6 mg/dL (ref 0.44–1.00)
Chloride: 106 mmol/L (ref 101–111)
Glucose, Bld: 87 mg/dL (ref 65–99)
HCT: 26 % — ABNORMAL LOW (ref 36.0–46.0)
Hemoglobin: 8.8 g/dL — ABNORMAL LOW (ref 12.0–15.0)
Potassium: 4 mmol/L (ref 3.5–5.1)
SODIUM: 139 mmol/L (ref 135–145)
TCO2: 20 mmol/L (ref 0–100)

## 2015-02-20 LAB — ABO/RH: ABO/RH(D): O POS

## 2015-02-20 MED ORDER — FERROUS SULFATE 325 (65 FE) MG PO TABS: 325.0000 mg | ORAL_TABLET | Freq: Every day | ORAL | Status: DC

## 2015-02-20 NOTE — ED Notes (Signed)
Patient reports that she has been having heavy menstrual periods.

## 2015-02-20 NOTE — ED Provider Notes (Signed)
CSN: [id number]     Arrival date & time 02/20/15  1322 History   First MD Initiated Contact with Patient 02/20/15 1533     Chief Complaint  Patient presents with  . Dysuria     (Consider location/radiation/quality/duration/timing/severity/associated sxs/prior Treatment) HPI 39 year old female who presents with urinary retention. History of appendectomy and is otherwise healthy. States that she has been in her usual state of health, and this morning was unable to urinate. Subsequently drink 3 bottles of water, and this had worsening bladder distension and low abdominal pain. Has not had this problem before, Denies dysuria, urinary frequency, fever, chills, N/V, diarrhea, abnormal vaginal discharge or bleeding.    Past Medical History  Diagnosis Date  . Strep throat    Past Surgical History  Procedure Laterality Date  . Appendectomy     Family History  Problem Relation Age of Onset  . Diabetes Mother    Social History  Substance Use Topics  . Smoking status: Never Smoker   . Smokeless tobacco: None  . Alcohol Use: Yes   OB History    Gravida Para Term Preterm AB TAB SAB Ectopic Multiple Living   1 1 1             Review of Systems 10/14 systems reviewed and are negative other than those stated in the HPI    Allergies  Review of patient's allergies indicates no known allergies.  Home Medications   Prior to Admission medications   Medication Sig Start Date End Date Taking? Authorizing Provider  BucAlfAspKGlucCouchParsUvaUrJu (NATURAL WATER PILLS PO) Take 2 tablets by mouth daily.   Yes Historical Provider, MD  ibuprofen (ADVIL,MOTRIN) 200 MG tablet Take 400 mg by mouth every 6 (six) hours as needed for moderate pain.   Yes Historical Provider, MD  ferrous sulfate 325 (65 FE) MG tablet Take 1 tablet (325 mg total) by mouth daily. 02/20/15   Forde Dandy, MD   BP 141/81 mmHg  Pulse 106  Temp(Src) 97.7 F (36.5 C) (Oral)  Resp 20  SpO2 100%  LMP  02/12/2015 Physical Exam Physical Exam  Nursing note and vitals reviewed. Constitutional: Well developed, well nourished, non-toxic, and in no acute distress Head: Normocephalic and atraumatic.  Mouth/Throat: Oropharynx is clear and moist.  Neck: Normal range of motion. Neck supple.  Cardiovascular: Normal rate and regular rhythm.   Pulmonary/Chest: Effort normal and breath sounds normal.  Abdominal: Soft. There is no tenderness. There is no rebound and no guarding.  Musculoskeletal: Normal range of motion.  Neurological: Alert, no facial droop, fluent speech, moves all extremities symmetrically Skin: Skin is warm and dry.  Psychiatric: Cooperative  ED Course  Procedures (including critical care time) Labs Review Labs Reviewed  URINALYSIS, ROUTINE W REFLEX MICROSCOPIC (NOT AT [facility]) - Abnormal; Notable for the following:    APPearance CLOUDY (*)    All other components within normal limits  CBC WITH DIFFERENTIAL/PLATELET - Abnormal; Notable for the following:    WBC 11.2 (*)    RBC 3.55 (*)    Hemoglobin 6.6 (*)    HCT 23.7 (*)    MCV 66.8 (*)    MCH 18.6 (*)    MCHC 27.8 (*)    RDW 18.4 (*)    Platelets 485 (*)    Neutro Abs 10.2 (*)    All other components within normal limits  BASIC METABOLIC PANEL - Abnormal; Notable for the following:    CO2 21 (*)    All other components within normal  limits  I-STAT CHEM 8, ED - Abnormal; Notable for the following:    Calcium, Ion 1.24 (*)    Hemoglobin 8.8 (*)    HCT 26.0 (*)    All other components within normal limits  URINE CULTURE  PREGNANCY, URINE  TYPE AND SCREEN    I have personally reviewed and evaluated these images and lab results as part of my medical decision-making.   MDM   Final diagnoses:  Pelvic prolapse  Urinary retention  Microcytic anemia   39 year old female who presents with urinary retention. Bladder scan at bedside revealing over 600 mL of urine. Her abdomen is soft and benign. Foley catheter  placed with 450 mL of UOP. Incidentally, cervix is noticed to have prolapsed to the vaginal introitus. Patient denies noticing this problem and has not had pelvic pressure. Pelvic prolapse likely etiology of urinary retention. Discussed with Dr. Kennon Rounds from Ob/Gyn regarding close follow-up. GYN office will call patient on Monday to set up appropriate follow-up. She is also given a referral to urology for Foley catheter management. UA shows no evidence of urinary tract infection. She has normal renal function. Blood work also initially incidentally notable for hemoglobin of 6.6. We did repeat this and she has a hemoglobin of 8.8. Anemia is microcytic in nature, and she does report heavy vaginal bleeding associated with her menses, which she just finished a few days ago. She does not have any symptoms of fatigue shortness of breath, lightheadedness, or syncope. There is no indication for blood transfusion here today. It started on iron pills, and asked to follow-up with GYN regarding this issue. A Foley catheter teaching was given. Strict return and follow-up instructions reviewed. She expressed understanding of all discharge instructions and felt comfortable to plan of care.    Forde Dandy, MD 02/20/15 [phone]

## 2015-02-20 NOTE — ED Notes (Signed)
Pt. changed into a leg bag and a clean large drainage bag given to the patient. Patient voiced understanding how to change the leg bag.

## 2015-02-20 NOTE — ED Notes (Signed)
This tech attempted foley cath and was unsuccessful. Cath stopped about 1/2 inch in and when retracted blood clots came out with it. MD notified and patient waiting for room placement.

## 2015-02-20 NOTE — ED Notes (Signed)
Pt with inability to void.  Hasn't urinated since yesterday.  Causing abdominal pain.  No fever.

## 2015-02-20 NOTE — Discharge Instructions (Signed)
Please return without fail for worsening symptoms, including worsening pain, vomiting unable to keep down food or fluids, fevers, or any other symptoms concerning to you. At the OB/GYN office will call you on Monday to set up appointment, but if you do not hear from them please call to set up an appointment with them. If they are unable to help manage your Foley catheter, please also follow up with the urologist listed above regarding trial of urination after Foley catheter.   Acute Urinary Retention, Female Acute urinary retention is the temporary inability to urinate. This is an uncommon problem in women. It can be caused by:  Infection.  A side effect of a medicine.  A problem in a nearby organ that presses or squeezes on the bladder or the urethra (the tube that drains the bladder).  Psychological problems.   Surgery on your bladder, urethra, or pelvic organs that causes obstruction to the outflow of urine from your bladder. HOME CARE INSTRUCTIONS  If you are sent home with a Foley catheter and a drainage system, you will need to discuss the best course of action with your health care provider. While the catheter is in, maintain a good intake of fluids. Keep the drainage bag emptied and lower than your catheter. This is so that contaminated urine will not flow back into your bladder, which could lead to a urinary tract infection. There are two main types of drainage bags. One is a large bag that usually is used at night. It has a good capacity that will allow you to sleep through the night without having to empty it. The second type is called a leg bag. It has a smaller capacity so it needs to be emptied more frequently. However, the main advantage is that it can be attached by a leg strap and goes underneath your clothing, allowing you the freedom to move about or leave your home. Only take over-the-counter or prescription medicines for pain, discomfort, or fever as directed by your health  care provider.  SEEK MEDICAL CARE IF:  You develop a low-grade fever.  You experience spasms or leakage of urine with the spasms. SEEK IMMEDIATE MEDICAL CARE IF:   You develop chills or fever.  Your catheter stops draining urine.  Your catheter falls out.  You start to develop increased bleeding that does not respond to rest and increased fluid intake. MAKE SURE YOU:  Understand these instructions.  Will watch your condition.  Will get help right away if you are not doing well or get worse.   This information is not intended to replace advice given to you by your health care provider. Make sure you discuss any questions you have with your health care provider.   Document Released: 03/13/2006 Document Revised: 07/29/2014 Document Reviewed: 08/23/2012 Elsevier Interactive Patient Education 2016 [employer].  Iron Deficiency Anemia, Adult Anemia is a condition in which there are less red blood cells or hemoglobin in the blood than normal. Hemoglobin is the part of red blood cells that carries oxygen. Iron deficiency anemia is anemia caused by too little iron. It is the most common type of anemia. It may leave you tired and short of breath. CAUSES   Lack of iron in the diet.  Poor absorption of iron, as seen with intestinal disorders.  Intestinal bleeding.  Heavy periods. SIGNS AND SYMPTOMS  Mild anemia may not be noticeable. Symptoms may include:  Fatigue.  Headache.  Pale skin.  Weakness.  Tiredness.  Shortness of breath.  Dizziness.  Cold hands and feet.  Fast or irregular heartbeat. DIAGNOSIS  Diagnosis requires a thorough evaluation and physical exam by your health care provider. Blood tests are generally used to confirm iron deficiency anemia. Additional tests may be done to find the underlying cause of your anemia. These may include:  Testing for blood in the stool (fecal occult blood test).  A procedure to see inside the colon and rectum  (colonoscopy).  A procedure to see inside the esophagus and stomach (endoscopy). TREATMENT  Iron deficiency anemia is treated by correcting the cause of the deficiency. Treatment may involve:  Adding iron-rich foods to your diet.  Taking iron supplements. Pregnant or breastfeeding women need to take extra iron because their normal diet usually does not provide the required amount.  Taking vitamins. Vitamin C improves the absorption of iron. Your health care provider may recommend that you take your iron tablets with a glass of orange juice or vitamin C supplement.  Medicines to make heavy menstrual flow lighter.  Surgery. HOME CARE INSTRUCTIONS   Take iron as directed by your health care provider.  If you cannot tolerate taking iron supplements by mouth, talk to your health care provider about taking them through a vein (intravenously) or an injection into a muscle.  For the best iron absorption, iron supplements should be taken on an empty stomach. If you cannot tolerate them on an empty stomach, you may need to take them with food.  Do not drink milk or take antacids at the same time as your iron supplements. Milk and antacids may interfere with the absorption of iron.  Iron supplements can cause constipation. Make sure to include fiber in your diet to prevent constipation. A stool softener may also be recommended.  Take vitamins as directed by your health care provider.  Eat a diet rich in iron. Foods high in iron include liver, lean beef, whole-grain bread, eggs, dried fruit, and dark green leafy vegetables. SEEK IMMEDIATE MEDICAL CARE IF:   You faint. If this happens, do not drive. Call your local emergency services (911 in [place] if no other help is available.  You have chest pain.  You feel nauseous or vomit.  You have severe or increased shortness of breath with activity.  You feel weak.  You have a rapid heartbeat.  You have unexplained sweating.  You become  light-headed when getting up from a chair or bed. MAKE SURE YOU:   Understand these instructions.  Will watch your condition.  Will get help right away if you are not doing well or get worse.   This information is not intended to replace advice given to you by your health care provider. Make sure you discuss any questions you have with your health care provider.   Document Released: 03/11/2000 Document Revised: 04/04/2014 Document Reviewed: 11/19/2012 Elsevier Interactive Patient Education 2016 [employer]  Pelvic Organ Prolapse Pelvic organ prolapse is the stretching, bulging, or dropping of pelvic organs into an abnormal position. It happens when the muscles and tissues that surround and support pelvic structures are stretched or weak. Pelvic organ prolapse can involve:  Vagina (vaginal prolapse).  Uterus (uterine prolapse).  Bladder (cystocele).  Rectum (rectocele).  Intestines (enterocele). When organs other than the vagina are involved, they often bulge into the vagina or protrude from the vagina, depending on how severe the prolapse is. CAUSES Causes of this condition include:  Pregnancy, labor, and childbirth.  Long-lasting (chronic) cough.  Chronic constipation.  Obesity.  Past pelvic  surgery.  Aging. During and after menopause, a decreased production of the hormone estrogen can weaken pelvic ligaments and muscles.  Consistently lifting more than 50 lb (23 kg).  Buildup of fluid in the abdomen due to certain diseases and other conditions. SYMPTOMS Symptoms of this condition include:  Loss of bladder control when you cough, sneeze, strain, and exercise (stress incontinence). This may be worse immediately following childbirth, and it may gradually improve over time.  Feeling pressure in your pelvis or vagina. This pressure may increase when you cough or when you are having a bowel movement.  A bulge that protrudes from the opening of your vagina or against  your vaginal wall. If your uterus protrudes through the opening of your vagina and rubs against your clothing, you may also experience soreness, ulcers, infection, pain, and bleeding.  Increased effort to have a bowel movement or urinate.  Pain in your low back.  Pain, discomfort, or disinterest in sexual intercourse.  Repeated bladder infections (urinary tract infections).  Difficulty inserting or inability to insert a tampon or applicator. In some people, this condition does not cause any symptoms. DIAGNOSIS Your health care provider may perform an internal and external vaginal and rectal exam. During the exam, you may be asked to cough and strain while you are lying down, sitting, and standing up. Your health care provider will determine if other tests are required, such as bladder function tests. TREATMENT In most cases, this condition needs to be treated only if it produces symptoms. No treatment is guaranteed to correct the prolapse or relieve the symptoms completely. Treatment may include:  Lifestyle changes, such as:  Avoiding drinking beverages that contain caffeine.  Increasing your intake of high-fiber foods. This can help to decrease constipation and straining during bowel movements.  Emptying your bladder at scheduled times (bladder training therapy). This can help to reduce or avoid urinary incontinence.  Losing weight if you are overweight or obese.  Estrogen. Estrogen may help mild prolapse by increasing the strength and tone of pelvic floor muscles.  Kegel exercises. These may help mild cases of prolapse by strengthening and tightening the muscles of the pelvic floor.  Pessary insertion. A pessary is a soft, flexible device that is placed into your vagina by your health care provider to help support the vaginal walls and keep pelvic organs in place.  Surgery. This is often the only form of treatment for severe prolapse. Different types of surgeries are  available. HOME CARE INSTRUCTIONS  Wear a sanitary pad or absorbent product if you have urinary incontinence.  Avoid heavy lifting and straining with exercise and work. Do not hold your breath when you perform mild to moderate lifting and exercise activities. Limit your activities as directed by your health care provider.  Take medicines only as directed by your health care provider.  Perform Kegel exercises as directed by your health care provider.  If you have a pessary, take care of it as directed by your health care provider. SEEK MEDICAL CARE IF:  Your symptoms interfere with your daily activities or sex life.  You need medicine to help with the discomfort.  You notice bleeding from the vagina that is not related to your period.  You have a fever.  You have pain or bleeding when you urinate.  You have bleeding when you have a bowel movement.  You lose urine when you have sex.  You have chronic constipation.  You have a pessary that falls out.  You have  vaginal discharge that has a bad smell.  You have low abdominal pain or cramping that is unusual for you.   This information is not intended to replace advice given to you by your health care provider. Make sure you discuss any questions you have with your health care provider.   Document Released: 10/09/2013 Document Reviewed: 10/09/2013 Elsevier Interactive Patient Education [employer].

## 2015-02-20 NOTE — ED Notes (Signed)
Bed: WA24 Expected date:  Expected time:  Means of arrival:  Comments: Hold for triage 

## 2015-02-22 LAB — URINE CULTURE: Culture: NO GROWTH

## 2015-02-24 ENCOUNTER — Telehealth: Payer: Self-pay | Admitting: *Deleted

## 2015-02-24 NOTE — Telephone Encounter (Signed)
Pt called and left message stating that she has a catheter in and it makes it hard for her to work. She wanted to know if we can give her something to make it better.

## 2015-02-25 NOTE — Telephone Encounter (Signed)
Patient called back into front office stating the catheter really bothers her if she is up and walking around for too long. Patient states it causes burning & irritation in her vagina. Per Hansel Feinstein, we can provider a letter to be out of work until appt on Friday. Informed patient that she can remain out of work & if she needs a letter for her job before Friday she may come by the clinics to pick the letter up. Patient verbalized understanding and states she will call her job & check with them. Patient had no other questions

## 2015-02-27 ENCOUNTER — Encounter: Payer: Self-pay | Admitting: Obstetrics and Gynecology

## 2015-02-27 ENCOUNTER — Other Ambulatory Visit: Payer: Self-pay | Admitting: Obstetrics and Gynecology

## 2015-02-27 ENCOUNTER — Ambulatory Visit (INDEPENDENT_AMBULATORY_CARE_PROVIDER_SITE_OTHER): Payer: 59 | Admitting: Obstetrics and Gynecology

## 2015-02-27 VITALS — BP 125/71 | HR 85 | Temp 97.7°F | Wt 163.3 lb

## 2015-02-27 DIAGNOSIS — N814 Uterovaginal prolapse, unspecified: Secondary | ICD-10-CM | POA: Diagnosis not present

## 2015-02-27 DIAGNOSIS — Z1151 Encounter for screening for human papillomavirus (HPV): Secondary | ICD-10-CM

## 2015-02-27 DIAGNOSIS — Z124 Encounter for screening for malignant neoplasm of cervix: Secondary | ICD-10-CM

## 2015-02-27 DIAGNOSIS — Z1231 Encounter for screening mammogram for malignant neoplasm of breast: Secondary | ICD-10-CM

## 2015-02-27 NOTE — Progress Notes (Addendum)
[place] scheduled for December 12th @ 1500.  Mammogram scheduled for December 21st @ 1500.  Pt notified.

## 2015-02-27 NOTE — Progress Notes (Signed)
Patient ID: Toni Valencia, female   DOB: Dec 06, 1975, 39 y.o.   MRN: [id number] 39 yo G1P1 here as an ED follow up for the evaluation of urinary retention and possible uterine prolapse. Patient is very bothered by this catheter and would like it removed. She denies any pelvic pain or pressure. She reports monthly cycles of 5 days which are heavy for the first 2 days. She denies CP, SOB, lightheadedness/dizziness.   Past Medical History  Diagnosis Date  . Strep throat    Past Surgical History  Procedure Laterality Date  . Appendectomy     Family History  Problem Relation Age of Onset  . Diabetes Mother    Social History  Substance Use Topics  . Smoking status: Never Smoker   . Smokeless tobacco: None  . Alcohol Use: Yes   ROS See pertinent in HPI Blood pressure 125/71, pulse 85, temperature 97.7 F (36.5 C), weight 163 lb 4.8 oz (74.072 kg), last menstrual period 02/12/2015.  GENERAL: Well-developed, well-nourished female in no acute distress.  ABDOMEN: Soft, nontender, nondistended. No organomegaly. PELVIC: Normal external female genitalia. Vagina is pink and rugated.  Normal discharge. Normal appearing cervix seen at the introitus. Pelvic exam very difficult as the patient is very tense and not tolerating the exam very well EXTREMITIES: No cyanosis, clubbing, or edema, 2+ distal pulses.  A/P 39 yo with uterine prolapse - Pap smear collected - Screening mammogram also ordered - Foley catheter removed and voiding trial performed - Will order a pelvic ultrasound to assess uterine size - Discussed physical exam findings and management plan with pessary vs hysterectomy. Patient is very undecided at this time. She truly does

## 2015-02-27 NOTE — Patient Instructions (Signed)
I will call you with the results of the ultrasound and inform you if you are a candidate for a vaginal hysterectomy vs robotic hysterectomy. If you are a candidate and you are interested in a robotic hysterectomy, please schedule your follow up appointment with Dr. Lavonia Drafts in January   Pelvic Organ Prolapse Pelvic organ prolapse is the stretching, bulging, or dropping of pelvic organs into an abnormal position. It happens when the muscles and tissues that surround and support pelvic structures are stretched or weak. Pelvic organ prolapse can involve:  Vagina (vaginal prolapse).  Uterus (uterine prolapse).  Bladder (cystocele).  Rectum (rectocele).  Intestines (enterocele). When organs other than the vagina are involved, they often bulge into the vagina or protrude from the vagina, depending on how severe the prolapse is. CAUSES Causes of this condition include:  Pregnancy, labor, and childbirth.  Long-lasting (chronic) cough.  Chronic constipation.  Obesity.  Past pelvic surgery.  Aging. During and after menopause, a decreased production of the hormone estrogen can weaken pelvic ligaments and muscles.  Consistently lifting more than 50 lb (23 kg).  Buildup of fluid in the abdomen due to certain diseases and other conditions. SYMPTOMS Symptoms of this condition include:  Loss of bladder control when you cough, sneeze, strain, and exercise (stress incontinence). This may be worse immediately following childbirth, and it may gradually improve over time.  Feeling pressure in your pelvis or vagina. This pressure may increase when you cough or when you are having a bowel movement.  A bulge that protrudes from the opening of your vagina or against your vaginal wall. If your uterus protrudes through the opening of your vagina and rubs against your clothing, you may also experience soreness, ulcers, infection, pain, and bleeding.  Increased effort to have a bowel  movement or urinate.  Pain in your low back.  Pain, discomfort, or disinterest in sexual intercourse.  Repeated bladder infections (urinary tract infections).  Difficulty inserting or inability to insert a tampon or applicator. In some people, this condition does not cause any symptoms. DIAGNOSIS Your health care provider may perform an internal and external vaginal and rectal exam. During the exam, you may be asked to cough and strain while you are lying down, sitting, and standing up. Your health care provider will determine if other tests are required, such as bladder function tests. TREATMENT In most cases, this condition needs to be treated only if it produces symptoms. No treatment is guaranteed to correct the prolapse or relieve the symptoms completely. Treatment may include:  Lifestyle changes, such as:  Avoiding drinking beverages that contain caffeine.  Increasing your intake of high-fiber foods. This can help to decrease constipation and straining during bowel movements.  Emptying your bladder at scheduled times (bladder training therapy). This can help to reduce or avoid urinary incontinence.  Losing weight if you are overweight or obese.  Estrogen. Estrogen may help mild prolapse by increasing the strength and tone of pelvic floor muscles.  Kegel exercises. These may help mild cases of prolapse by strengthening and tightening the muscles of the pelvic floor.  Pessary insertion. A pessary is a soft, flexible device that is placed into your vagina by your health care provider to help support the vaginal walls and keep pelvic organs in place.  Surgery. This is often the only form of treatment for severe prolapse. Different types of surgeries are available. HOME CARE INSTRUCTIONS  Wear a sanitary pad or absorbent product if you have urinary incontinence.  Avoid  heavy lifting and straining with exercise and work. Do not hold your breath when you perform mild to moderate  lifting and exercise activities. Limit your activities as directed by your health care provider.  Take medicines only as directed by your health care provider.  Perform Kegel exercises as directed by your health care provider.  If you have a pessary, take care of it as directed by your health care provider. SEEK MEDICAL CARE IF:  Your symptoms interfere with your daily activities or sex life.  You need medicine to help with the discomfort.  You notice bleeding from the vagina that is not related to your period.  You have a fever.  You have pain or bleeding when you urinate.  You have bleeding when you have a bowel movement.  You lose urine when you have sex.  You have chronic constipation.  You have a pessary that falls out.  You have vaginal discharge that has a bad smell.  You have low abdominal pain or cramping that is unusual for you.   This information is not intended to replace advice given to you by your health care provider. Make sure you discuss any questions you have with your health care provider.   Document Released: 10/09/2013 Document Reviewed: 10/09/2013 Elsevier Interactive Patient Education [employer].  Hysterectomy Information  A hysterectomy is a surgery in which your uterus is removed. This surgery may be done to treat various medical problems. After the surgery, you will no longer have menstrual periods. The surgery will also make you unable to become pregnant (sterile). The fallopian tubes and ovaries can be removed (bilateral salpingo-oophorectomy) during this surgery as well.  REASONS FOR A HYSTERECTOMY  Persistent, abnormal bleeding.  Lasting (chronic) pelvic pain or infection.  The lining of the uterus (endometrium) starts growing outside the uterus (endometriosis).  The endometrium starts growing in the muscle of the uterus (adenomyosis).  The uterus falls down into the vagina (pelvic organ prolapse).  Noncancerous growths in the  uterus (uterine fibroids) that cause symptoms.  Precancerous cells.  Cervical cancer or uterine cancer. TYPES OF HYSTERECTOMIES  Supracervical hysterectomy--In this type, the top part of the uterus is removed, but not the cervix.  Total hysterectomy--The uterus and cervix are removed.  Radical hysterectomy--The uterus, the cervix, and the fibrous tissue that holds the uterus in place in the pelvis (parametrium) are removed. WAYS A HYSTERECTOMY CAN BE PERFORMED  Abdominal hysterectomy--A large surgical cut (incision) is made in the abdomen. The uterus is removed through this incision.  Vaginal hysterectomy--An incision is made in the vagina. The uterus is removed through this incision. There are no abdominal incisions.  Conventional laparoscopic hysterectomy--Three or four small incisions are made in the abdomen. A thin, lighted tube with a camera (laparoscope) is inserted into one of the incisions. Other tools are put through the other incisions. The uterus is cut into small pieces. The small pieces are removed through the incisions, or they are removed through the vagina.  Laparoscopically assisted vaginal hysterectomy (LAVH)--Three or four small incisions are made in the abdomen. Part of the surgery is performed laparoscopically and part vaginally. The uterus is removed through the vagina.  Robot-assisted laparoscopic hysterectomy--A laparoscope and other tools are inserted into 3 or 4 small incisions in the abdomen. A computer-controlled device is used to give the surgeon a 3D image and to help control the surgical instruments. This allows for more precise movements of surgical instruments. The uterus is cut into small pieces and  removed through the incisions or removed through the vagina. RISKS AND COMPLICATIONS  Possible complications associated with this procedure include:  Bleeding and risk of blood transfusion. Tell your health care provider if you do not want to receive any blood  products.  Blood clots in the legs or lung.  Infection.  Injury to surrounding organs.  Problems or side effects related to anesthesia.  Conversion to an abdominal hysterectomy from one of the other techniques. WHAT TO EXPECT AFTER A HYSTERECTOMY  You will be given pain medicine.  You will need to have someone with you for the first 3-5 days after you go home.  You will need to follow up with your surgeon in 2-4 weeks after surgery to evaluate your progress.  You may have early menopause symptoms such as hot flashes, night sweats, and insomnia.  If you had a hysterectomy for a problem that was not cancer or not a condition that could lead to cancer, then you no longer need Pap tests. However, even if you no longer need a Pap test, a regular exam is a good idea to make sure no other problems are starting.   This information is not intended to replace advice given to you by your health care provider. Make sure you discuss any questions you have with your health care provider.   Document Released: 09/07/2000 Document Revised: 01/02/2013 Document Reviewed: 11/19/2012 Elsevier Interactive Patient Education [employer].

## 2015-03-02 LAB — CYTOLOGY - PAP

## 2015-03-06 ENCOUNTER — Other Ambulatory Visit (INDEPENDENT_AMBULATORY_CARE_PROVIDER_SITE_OTHER): Payer: 59 | Admitting: General Practice

## 2015-03-06 ENCOUNTER — Telehealth: Payer: Self-pay

## 2015-03-06 DIAGNOSIS — N390 Urinary tract infection, site not specified: Secondary | ICD-10-CM | POA: Diagnosis not present

## 2015-03-06 LAB — POCT URINALYSIS DIP (DEVICE)
Glucose, UA: NEGATIVE mg/dL
Nitrite: POSITIVE — AB
Specific Gravity, Urine: 1.025 (ref 1.005–1.030)
UROBILINOGEN UA: 2 mg/dL — AB (ref 0.0–1.0)
pH: 5.5 (ref 5.0–8.0)

## 2015-03-06 MED ORDER — SULFAMETHOXAZOLE-TRIMETHOPRIM 800-160 MG PO TABS: 1.0000 | ORAL_TABLET | Freq: Two times a day (BID) | ORAL | Status: DC

## 2015-03-06 MED ORDER — PHENAZOPYRIDINE HCL 200 MG PO TABS: 200.0000 mg | ORAL_TABLET | Freq: Three times a day (TID) | ORAL | Status: DC | PRN

## 2015-03-06 NOTE — Addendum Note (Signed)
Addended by: Shelly Coss on: 03/06/2015 11:21 AM   Modules accepted: Orders

## 2015-03-06 NOTE — Progress Notes (Signed)
Patient here today for UA due to recent indwelling foley catheter now presenting with a fever. Informed patient that UA does show infection and it will be sent off for culture. Told patient we will send some antibiotics to her pharmacy & a medication for pain due to bladder spasms. Told patient we may need to change the antibiotic once her culture comes back, but if so we will call and let her know. Also recommended she take tylenol for fever but not to exceed more than 8 extra strength in 24 hours. Patient verbalized understanding to all and had no questions. Per Dr Roselie Awkward, patient needs 7 days of bactrim.

## 2015-03-06 NOTE — Telephone Encounter (Signed)
Spoke with patient who stated she called several days ago with high fever 104.0. Spoke with Dr.Dove and advised patient to seek medical attention for her high fever. Pt stated she understood and would follow up with her family provider or ER.

## 2015-03-08 LAB — URINE CULTURE

## 2015-03-09 ENCOUNTER — Encounter: Payer: 59 | Admitting: Student

## 2015-03-09 ENCOUNTER — Telehealth: Payer: Self-pay | Admitting: *Deleted

## 2015-03-09 ENCOUNTER — Ambulatory Visit (HOSPITAL_COMMUNITY)
Admission: RE | Admit: 2015-03-09 | Discharge: 2015-03-09 | Disposition: A | Discharge status: Home / Self Care | Payer: 59 | Source: Ambulatory Visit | Attending: Obstetrics and Gynecology | Admitting: Obstetrics and Gynecology

## 2015-03-09 DIAGNOSIS — N814 Uterovaginal prolapse, unspecified: Secondary | ICD-10-CM | POA: Diagnosis not present

## 2015-03-09 DIAGNOSIS — D259 Leiomyoma of uterus, unspecified: Secondary | ICD-10-CM | POA: Insufficient documentation

## 2015-03-09 NOTE — Telephone Encounter (Signed)
Called patient to check on her and see if she is feeling better. Per Dr. Kennon Rounds patient may have Keflex 500mg  tid for 10 days if not feeling better. Left patient a message to call us back.

## 2015-03-10 NOTE — Telephone Encounter (Signed)
Called patient and she stated that she is feeling much better. She is scheduled to come in on Friday to see Dr. Elly Modena.

## 2015-03-13 ENCOUNTER — Ambulatory Visit (INDEPENDENT_AMBULATORY_CARE_PROVIDER_SITE_OTHER): Payer: 59 | Admitting: Obstetrics and Gynecology

## 2015-03-13 ENCOUNTER — Encounter: Payer: Self-pay | Admitting: Obstetrics and Gynecology

## 2015-03-13 VITALS — BP 120/60 | HR 100 | Temp 98.3°F | Ht 69.0 in | Wt 157.0 lb

## 2015-03-13 DIAGNOSIS — N814 Uterovaginal prolapse, unspecified: Secondary | ICD-10-CM

## 2015-03-13 NOTE — Progress Notes (Signed)
Patient ID: Toni Valencia, female   DOB: June 28, 1975, 39 y.o.   MRN: [id number] 39 yo G1P1 presenting today for further management of her uterine prolapse. Patient reports being diagnosed with a UTI as a result of prolonged urinary catheter use. Patient states that she has been able to void without further symptoms of urinary retention. Patient is currently without any complaints. Following further discussion with her family, the patient is interested in a hysterectomy.  Past Medical History  Diagnosis Date  . Strep throat    Past Surgical History  Procedure Laterality Date  . Appendectomy     Family History  Problem Relation Age of Onset  . Diabetes Mother    Social History  Substance Use Topics  . Smoking status: Never Smoker   . Smokeless tobacco: None  . Alcohol Use: Yes   ROS See pertinent in HPI  Blood pressure 120/60, pulse 100, temperature 98.3 F (36.8 C), temperature source Oral, height 5\' 9"  (1.753 m), weight 157 lb (71.215 kg), last menstrual period 03/08/2015. GENERAL: Well-developed, well-nourished female in no acute distress.  ABDOMEN: Soft, nontender, nondistended. Palpable uterus in suprapubic region. PELVIC: Normal external female genitalia. Vagina is pink and rugated.  Normal discharge. Normal appearing cervix at the introitus. Uterus is 16 week in size. No adnexal mass or tenderness. EXTREMITIES: No cyanosis, clubbing, or edema, 2+ distal pulses.  03/09/2015 Ultrasound FINDINGS: Uterus  Measurements: 13.3 x 9.0 x 11 0.4 seconds. Multiple mass lesions noted throughout the uterus consistent with fibroids. The largest is in the right body and measures 6.4 cm.  Endometrium  Thickness: 10 mm. No focal abnormality visualized.  Right ovary  Measurements: 3.3 x 2.1 x 2.1 cm. Normal appearance/no adnexal mass.  Left ovary  Measurements: 3.2 x 1.7 x 2.0 cm. Normal appearance/no adnexal mass.  Other findings  Trace free pelvic  fluid.  IMPRESSION: Fibroid uterus. Trace free pelvic fluid.    39 yo with uterine prolapse and h/o urinary retention - Ultrasound results were reviewed and explained. - Will refer the patient to urogynecologist- Dr. Rodena Piety in [facility] for surgical treatment options. Given the degree of prolapse at such a young age, there is a concern that vaginal apical support may be required which I am not comfortable performing. Patient verbalized understanding and all questions were answered - RTC prn

## 2015-03-18 ENCOUNTER — Ambulatory Visit
Admission: RE | Admit: 2015-03-18 | Discharge: 2015-03-18 | Disposition: A | Discharge status: Home / Self Care | Payer: 59 | Source: Ambulatory Visit | Attending: Obstetrics and Gynecology | Admitting: Obstetrics and Gynecology

## 2015-03-18 DIAGNOSIS — Z1231 Encounter for screening mammogram for malignant neoplasm of breast: Secondary | ICD-10-CM

## 2015-05-21 ENCOUNTER — Encounter (HOSPITAL_COMMUNITY): Payer: Self-pay | Admitting: *Deleted

## 2015-05-21 ENCOUNTER — Inpatient Hospital Stay (HOSPITAL_COMMUNITY)
Admission: AD | Admit: 2015-05-21 | Discharge: 2015-05-21 | Disposition: A | Discharge status: Home / Self Care | Payer: 59 | Source: Ambulatory Visit | Attending: Obstetrics & Gynecology | Admitting: Obstetrics & Gynecology

## 2015-05-21 DIAGNOSIS — N939 Abnormal uterine and vaginal bleeding, unspecified: Secondary | ICD-10-CM | POA: Diagnosis not present

## 2015-05-21 DIAGNOSIS — N3000 Acute cystitis without hematuria: Secondary | ICD-10-CM

## 2015-05-21 DIAGNOSIS — N814 Uterovaginal prolapse, unspecified: Secondary | ICD-10-CM | POA: Insufficient documentation

## 2015-05-21 DIAGNOSIS — N390 Urinary tract infection, site not specified: Secondary | ICD-10-CM | POA: Diagnosis not present

## 2015-05-21 HISTORY — DX: Anemia, unspecified: D64.9

## 2015-05-21 LAB — POCT PREGNANCY, URINE: Preg Test, Ur: NEGATIVE

## 2015-05-21 LAB — URINALYSIS, ROUTINE W REFLEX MICROSCOPIC
BILIRUBIN URINE: NEGATIVE
Glucose, UA: 100 mg/dL — AB
Ketones, ur: 15 mg/dL — AB
NITRITE: POSITIVE — AB
Protein, ur: 100 mg/dL — AB
SPECIFIC GRAVITY, URINE: 1.015 (ref 1.005–1.030)
pH: 7.5 (ref 5.0–8.0)

## 2015-05-21 LAB — CBC
HCT: 36.9 % (ref 36.0–46.0)
Hemoglobin: 11.7 g/dL — ABNORMAL LOW (ref 12.0–15.0)
MCH: 27.5 pg (ref 26.0–34.0)
MCHC: 31.7 g/dL (ref 30.0–36.0)
MCV: 86.8 fL (ref 78.0–100.0)
Platelets: 387 10*3/uL (ref 150–400)
RBC: 4.25 MIL/uL (ref 3.87–5.11)
WBC: 8.3 10*3/uL (ref 4.0–10.5)

## 2015-05-21 LAB — URINE MICROSCOPIC-ADD ON: WBC UA: NONE SEEN WBC/hpf (ref 0–5)

## 2015-05-21 MED ORDER — NITROFURANTOIN MONOHYD MACRO 100 MG PO CAPS: 100.0000 mg | ORAL_CAPSULE | Freq: Two times a day (BID) | ORAL | Status: DC

## 2015-05-21 MED ORDER — MEGESTROL ACETATE 40 MG PO TABS: 40.0000 mg | ORAL_TABLET | Freq: Two times a day (BID) | ORAL | Status: DC

## 2015-05-21 NOTE — MAU Note (Signed)
Bleeding really  Really heavy.  6pads in less than 2 hrs.   Is scheduled for hysterectomy on Monday.

## 2015-05-21 NOTE — Discharge Instructions (Signed)
Abnormal Uterine Bleeding Abnormal uterine bleeding means bleeding from the vagina that is not your normal menstrual period. This can be:  Bleeding or spotting between periods.  Bleeding after sex (sexual intercourse).  Bleeding that is heavier or more than normal.  Periods that last longer than usual.  Bleeding after menopause. There are many problems that may cause this. Treatment will depend on the cause of the bleeding. Any kind of bleeding that is not normal should be reviewed by your doctor.  HOME CARE Watch your condition for any changes. These actions may lessen any discomfort you are having:  Do not use tampons or douches as told by your doctor.  Change your pads often. You should get regular pelvic exams and Pap tests. Keep all appointments for tests as told by your doctor. GET HELP IF:  You are bleeding for more than 1 week.  You feel dizzy at times. GET HELP RIGHT AWAY IF:   You pass out.  You have to change pads every 15 to 30 minutes.  You have belly pain.  You have a fever.  You become sweaty or weak.  You are passing large blood clots from the vagina.  You feel sick to your stomach (nauseous) and throw up (vomit). MAKE SURE YOU:  Understand these instructions.  Will watch your condition.  Will get help right away if you are not doing well or get worse.   This information is not intended to replace advice given to you by your health care provider. Make sure you discuss any questions you have with your health care provider.   Document Released: 01/09/2009 Document Revised: 03/19/2013 Document Reviewed: 10/11/2012 Elsevier Interactive Patient Education 2016 [employer]. Asymptomatic Bacteriuria, Female Asymptomatic bacteriuria is the presence of a large number of bacteria in your urine without the usual symptoms of burning or frequent urination. The following conditions increase the risk of asymptomatic bacteriuria:  Diabetes  mellitus.  Advanced age.  Pregnancy in the first trimester.  Kidney stones.  Kidney transplants.  Leaky kidney tube valve in young children (reflux). Treatment for this condition is not needed in most people and can lead to other problems such as too much yeast and growth of resistant bacteria. However, some people, such as pregnant women, do need treatment to prevent kidney infection. Asymptomatic bacteriuria in pregnancy is also associated with fetal growth restriction, premature labor, and newborn death. HOME CARE INSTRUCTIONS Monitor your condition for any changes. The following actions may help to relieve any discomfort you are feeling:  Drink enough water and fluids to keep your urine clear or pale yellow. Go to the bathroom more often to keep your bladder empty.  Keep the area around your vagina and rectum clean. Wipe yourself from front to back after urinating. SEEK IMMEDIATE MEDICAL CARE IF:  You develop signs of an infection such as:  Burning with urination.  Frequency of voiding.  Back pain.  Fever.  You have blood in the urine.  You develop a fever. MAKE SURE YOU:  Understand these instructions.  Will watch your condition.  Will get help right away if you are not doing well or get worse.   This information is not intended to replace advice given to you by your health care provider. Make sure you discuss any questions you have with your health care provider.   Document Released: 03/14/2005 Document Revised: 04/04/2014 Document Reviewed: 09/03/2012 Elsevier Interactive Patient Education 2016 Else[employer] Pelvic Organ Prolapse Pelvic organ prolapse is the stretching, bulging, or  dropping of pelvic organs into an abnormal position. It happens when the muscles and tissues that surround and support pelvic structures are stretched or weak. Pelvic organ prolapse can involve:  Vagina (vaginal prolapse).  Uterus (uterine prolapse).  Bladder  (cystocele).  Rectum (rectocele).  Intestines (enterocele). When organs other than the vagina are involved, they often bulge into the vagina or protrude from the vagina, depending on how severe the prolapse is. CAUSES Causes of this condition include:  Pregnancy, labor, and childbirth.  Long-lasting (chronic) cough.  Chronic constipation.  Obesity.  Past pelvic surgery.  Aging. During and after menopause, a decreased production of the hormone estrogen can weaken pelvic ligaments and muscles.  Consistently lifting more than 50 lb (23 kg).  Buildup of fluid in the abdomen due to certain diseases and other conditions. SYMPTOMS Symptoms of this condition include:  Loss of bladder control when you cough, sneeze, strain, and exercise (stress incontinence). This may be worse immediately following childbirth, and it may gradually improve over time.  Feeling pressure in your pelvis or vagina. This pressure may increase when you cough or when you are having a bowel movement.  A bulge that protrudes from the opening of your vagina or against your vaginal wall. If your uterus protrudes through the opening of your vagina and rubs against your clothing, you may also experience soreness, ulcers, infection, pain, and bleeding.  Increased effort to have a bowel movement or urinate.  Pain in your low back.  Pain, discomfort, or disinterest in sexual intercourse.  Repeated bladder infections (urinary tract infections).  Difficulty inserting or inability to insert a tampon or applicator. In some people, this condition does not cause any symptoms. DIAGNOSIS Your health care provider may perform an internal and external vaginal and rectal exam. During the exam, you may be asked to cough and strain while you are lying down, sitting, and standing up. Your health care provider will determine if other tests are required, such as bladder function tests. TREATMENT In most cases, this condition  needs to be treated only if it produces symptoms. No treatment is guaranteed to correct the prolapse or relieve the symptoms completely. Treatment may include:  Lifestyle changes, such as:  Avoiding drinking beverages that contain caffeine.  Increasing your intake of high-fiber foods. This can help to decrease constipation and straining during bowel movements.  Emptying your bladder at scheduled times (bladder training therapy). This can help to reduce or avoid urinary incontinence.  Losing weight if you are overweight or obese.  Estrogen. Estrogen may help mild prolapse by increasing the strength and tone of pelvic floor muscles.  Kegel exercises. These may help mild cases of prolapse by strengthening and tightening the muscles of the pelvic floor.  Pessary insertion. A pessary is a soft, flexible device that is placed into your vagina by your health care provider to help support the vaginal walls and keep pelvic organs in place.  Surgery. This is often the only form of treatment for severe prolapse. Different types of surgeries are available. HOME CARE INSTRUCTIONS  Wear a sanitary pad or absorbent product if you have urinary incontinence.  Avoid heavy lifting and straining with exercise and work. Do not hold your breath when you perform mild to moderate lifting and exercise activities. Limit your activities as directed by your health care provider.  Take medicines only as directed by your health care provider.  Perform Kegel exercises as directed by your health care provider.  If you have a pessary, take  care of it as directed by your health care provider. SEEK MEDICAL CARE IF:  Your symptoms interfere with your daily activities or sex life.  You need medicine to help with the discomfort.  You notice bleeding from the vagina that is not related to your period.  You have a fever.  You have pain or bleeding when you urinate.  You have bleeding when you have a bowel  movement.  You lose urine when you have sex.  You have chronic constipation.  You have a pessary that falls out.  You have vaginal discharge that has a bad smell.  You have low abdominal pain or cramping that is unusual for you.   This information is not intended to replace advice given to you by your health care provider. Make sure you discuss any questions you have with your health care provider.   Document Released: 10/09/2013 Document Reviewed: 10/09/2013 Elsevier Interactive Patient Education [employer].

## 2015-05-21 NOTE — MAU Provider Note (Signed)
History     CSN: [id number]  Arrival date and time: 05/21/15 1310   First Provider Initiated Contact with Patient 05/21/15 1426      Chief Complaint  Patient presents with  . Vaginal Bleeding   HPI Ms. Toni Valencia is a 40 y.o. G1P1001 who presents to MAU today with complaint of vaginal bleeding. The states a history of heavy bleeding and uterine prolapse. She is scheduled for hysterectomy on Monday at [facility]. She states that she has been taking OCPs for bleeding control. She is currently on the last week of her pill pack. She has used 7 pads today. She states off and on spotting recently, but heavy bleeding since late this morning. She states mild associated cramping. She denies use of pain medications. She also denies fever, UTI symptoms, weakness, dizziness or SOB. She does endorse occasionally feeling lightheaded.   OB History    Gravida Para Term Preterm AB TAB SAB Ectopic Multiple Living   1 1 1       1       Past Medical History  Diagnosis Date  . Strep throat   . Anemia     Past Surgical History  Procedure Laterality Date  . Appendectomy      Family History  Problem Relation Age of Onset  . Diabetes Mother     Social History  Substance Use Topics  . Smoking status: Never Smoker   . Smokeless tobacco: None  . Alcohol Use: Yes    Allergies: No Known Allergies  No prescriptions prior to admission    Review of Systems  Constitutional: Negative for fever and malaise/fatigue.  Gastrointestinal: Positive for abdominal pain.  Genitourinary: Negative for dysuria, urgency and frequency.       + vaginal bleeding  Neurological: Negative for dizziness, loss of consciousness and weakness.   Physical Exam   Blood pressure 134/90, pulse 70, temperature 97.9 F (36.6 C), temperature source Oral, resp. rate 18, last menstrual period 05/04/2015.  Physical Exam  Nursing note and vitals reviewed. Constitutional: She is oriented to person, place, and time. She  appears well-developed and well-nourished. No distress.  HENT:  Head: Normocephalic and atraumatic.  Cardiovascular: Normal rate.   Respiratory: Effort normal.  GI: Soft. She exhibits no distension and no mass. There is tenderness (mild tenderness to palpation of the lower abdomen). There is no rebound and no guarding.  Genitourinary: Uterus is not enlarged and not tender. Cervix exhibits no motion tenderness, no discharge and no friability. Right adnexum displays no mass and no tenderness. Left adnexum displays no mass and no tenderness. There is bleeding (small blood noted in vaginal vault) in the vagina. No vaginal discharge found.  Moderate blood on pad prior to exam after 1 hour. Difficult to assess amount of blood vaginally due to significant uterine prolapse. Cervix is almost at the level of the introitus.   Neurological: She is alert and oriented to person, place, and time.  Skin: Skin is warm and dry. No erythema.  Psychiatric: She has a normal mood and affect.   Results for orders placed or performed during the hospital encounter of 05/21/15 (from the past 24 hour(s))  Urinalysis, Routine w reflex microscopic (not at [facility])     Status: Abnormal   Collection Time: 05/21/15  1:01 PM  Result Value Ref Range   Color, Urine RED (A) YELLOW   APPearance CLOUDY (A) CLEAR   Specific Gravity, Urine 1.015 1.005 - 1.030   pH 7.5 5.0 - 8.0  Glucose, UA 100 (A) NEGATIVE mg/dL   Hgb urine dipstick LARGE (A) NEGATIVE   Bilirubin Urine NEGATIVE NEGATIVE   Ketones, ur 15 (A) NEGATIVE mg/dL   Protein, ur 100 (A) NEGATIVE mg/dL   Nitrite POSITIVE (A) NEGATIVE   Leukocytes, UA TRACE (A) NEGATIVE  Urine microscopic-add on     Status: Abnormal   Collection Time: 05/21/15  1:01 PM  Result Value Ref Range   Squamous Epithelial / LPF 0-5 (A) NONE SEEN   WBC, UA NONE SEEN 0 - 5 WBC/hpf   RBC / HPF TOO NUMEROUS TO COUNT 0 - 5 RBC/hpf   Bacteria, UA FEW (A) NONE SEEN  CBC     Status: Abnormal    Collection Time: 05/21/15  2:06 PM  Result Value Ref Range   WBC 8.3 4.0 - 10.5 K/uL   RBC 4.25 3.87 - 5.11 MIL/uL   Hemoglobin 11.7 (L) 12.0 - 15.0 g/dL   HCT 36.9 36.0 - 46.0 %   MCV 86.8 78.0 - 100.0 fL   MCH 27.5 26.0 - 34.0 pg   MCHC 31.7 30.0 - 36.0 g/dL   RDW NOT CALCULATED 11.5 - 15.5 %   Platelets 387 150 - 400 K/uL  Pregnancy, urine POC     Status: None   Collection Time: 05/21/15  2:17 PM  Result Value Ref Range   Preg Test, Ur NEGATIVE NEGATIVE    MAU Course  Procedures None  MDM UPT - negative UA, CBC today Patient is hemodynamically stable Discussed patient with Dr. Ihor Dow. Ok to start Megace for bleeding control.   Assessment and Plan  A: AUB Uterine prolapse UTI   P: Discharge home Rx for Megace and Macrobid given to patient  Bleeding precautions discussed Patient advised to follow-up with Surgeon at [facility] as scheduled for surgery Monday Patient may return to MAU as needed or if her condition were to change or worsen   Luvenia Redden, PA-C  05/21/2015, 3:25 PM

## 2016-06-04 ENCOUNTER — Ambulatory Visit (HOSPITAL_COMMUNITY)
Admission: EM | Admit: 2016-06-04 | Discharge: 2016-06-04 | Disposition: A | Discharge status: Home / Self Care | Payer: 59 | Attending: Internal Medicine | Admitting: Internal Medicine

## 2016-06-04 ENCOUNTER — Encounter (HOSPITAL_COMMUNITY): Payer: Self-pay | Admitting: Emergency Medicine

## 2016-06-04 DIAGNOSIS — M545 Low back pain, unspecified: Secondary | ICD-10-CM

## 2016-06-04 LAB — POCT URINALYSIS DIP (DEVICE)
BILIRUBIN URINE: NEGATIVE
GLUCOSE, UA: NEGATIVE mg/dL
KETONES UR: NEGATIVE mg/dL
Leukocytes, UA: NEGATIVE
Nitrite: NEGATIVE
PROTEIN: NEGATIVE mg/dL
SPECIFIC GRAVITY, URINE: 1.025 (ref 1.005–1.030)
Urobilinogen, UA: 0.2 mg/dL (ref 0.0–1.0)
pH: 6 (ref 5.0–8.0)

## 2016-06-04 MED ORDER — DICLOFENAC SODIUM 75 MG PO TBEC: 75.0000 mg | DELAYED_RELEASE_TABLET | Freq: Two times a day (BID) | ORAL | 0 refills | Status: AC

## 2016-06-04 MED ORDER — CYCLOBENZAPRINE HCL 10 MG PO TABS: 10.0000 mg | ORAL_TABLET | Freq: Two times a day (BID) | ORAL | 0 refills | Status: DC | PRN

## 2016-06-04 MED ORDER — KETOROLAC TROMETHAMINE 60 MG/2ML IM SOLN
INTRAMUSCULAR | Status: AC
  Filled 2016-06-04: qty 2

## 2016-06-04 MED ORDER — KETOROLAC TROMETHAMINE 60 MG/2ML IM SOLN
60.0000 mg | Freq: Once | INTRAMUSCULAR | Status: AC
  Administered 2016-06-04: 60 mg via INTRAMUSCULAR

## 2016-06-04 NOTE — ED Triage Notes (Signed)
Pt states she has been having a sharp twisting pain in her lower left flank since last night.  She states the pain is in her lower left back and goes around her left side.

## 2016-06-04 NOTE — Discharge Instructions (Signed)
Continue to rest.

## 2016-06-04 NOTE — ED Provider Notes (Signed)
CSN: [id number]     Arrival date & time 06/04/16  1631 History   None    Chief Complaint  Patient presents with  . Back Pain   (Consider location/radiation/quality/duration/timing/severity/associated sxs/prior Treatment)  41y.o. female presents with lower back pain  X 2 days. Patient denies and injury or trauma. 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 Patient denies any radiation of pain, or pain with urination Condition is acute in nature. Condition is made better by nothing. Condition is made worse by movement and standinf Patient denies any relief  prior to there arrival at this facility.        Past Medical History:  Diagnosis Date  . Anemia   . Strep throat    Past Surgical History:  Procedure Laterality Date  . ABDOMINAL HYSTERECTOMY    . APPENDECTOMY     Family History  Problem Relation Age of Onset  . Diabetes Mother    Social History  Substance Use Topics  . Smoking status: Never Smoker  . Smokeless tobacco: Never Used  . Alcohol use Yes   OB History    Gravida Para Term Preterm AB Living   1 1 1     1    SAB TAB Ectopic Multiple Live Births                 Review of Systems  Constitutional: Negative for chills and fever.  HENT: Negative for ear pain and sore throat.   Eyes: Negative for pain and visual disturbance.  Respiratory: Negative for cough and shortness of breath.   Cardiovascular: Negative for chest pain and palpitations.  Gastrointestinal: Negative for abdominal pain and vomiting.  Genitourinary: Negative for dysuria and hematuria.  Musculoskeletal: Positive for back pain. Negative for arthralgias.  Skin: Negative for color change and rash.  Neurological: Negative for seizures and syncope.  All other systems reviewed and are negative.   Allergies  Patient has no known allergies.  Home Medications   Prior to Admission medications   Medication Sig Start Date End Date Taking? Authorizing Provider   cyclobenzaprine (FLEXERIL) 10 MG tablet Take 1 tablet (10 mg total) by mouth 2 (two) times daily as needed for muscle spasms. 06/04/16   Jacqualine Mau, NP  diclofenac (VOLTAREN) 75 MG EC tablet Take 1 tablet (75 mg total) by mouth 2 (two) times daily. 06/04/16 06/11/16  Jacqualine Mau, NP  ferrous sulfate 325 (65 FE) MG tablet Take 1 tablet (325 mg total) by mouth daily. Patient taking differently: Take 325 mg by mouth 3 (three) times daily with meals.  02/20/15   Forde Dandy, MD  ibuprofen (ADVIL,MOTRIN) 200 MG tablet Take 400 mg by mouth every 6 (six) hours as needed for headache, mild pain or moderate pain.    Historical Provider, MD  megestrol (MEGACE) 40 MG tablet Take 1 tablet (40 mg total) by mouth 2 (two) times daily. 05/21/15   Luvenia Redden, PA-C  nitrofurantoin, macrocrystal-monohydrate, (MACROBID) 100 MG capsule Take 1 capsule (100 mg total) by mouth 2 (two) times daily. 05/21/15   Luvenia Redden, PA-C  norgestimate-ethinyl estradiol (ORTHO-CYCLEN,SPRINTEC,PREVIFEM) 0.25-35 MG-MCG tablet Take 1 tablet by mouth daily.    Historical Provider, MD  polyethylene glycol powder (MIRALAX) powder Take 1 Container by mouth every other day as needed for mild constipation or moderate constipation.    Historical Provider, MD   Meds Ordered and Administered this Visit   Medications  ketorolac (TORADOL) injection 60 mg (60  mg Intramuscular Given 06/04/16 1748)    BP 159/89 (BP Location: Right Arm)   Pulse 87   Temp 99.3 F (37.4 C) (Oral)   Resp 16   LMP 05/04/2015 Comment: light bleeding for a month, became heavy today  SpO2 99%  No data found.   Physical Exam  Constitutional: She is oriented to person, place, and time. She appears well-developed and well-nourished.  HENT:  Head: Normocephalic and atraumatic.  Eyes: Conjunctivae are normal.  Neck: Normal range of motion.  Cardiovascular: Normal rate and regular rhythm.   Pulmonary/Chest: Effort normal and breath sounds  normal.  Musculoskeletal: Tenderness: lower back pain  increased tenderness at internal oblique muscle.  Neurological: She is alert and oriented to person, place, and time.  Skin: Skin is warm.  Psychiatric: She has a normal mood and affect.  Nursing note and vitals reviewed.   Urgent Care Course     Procedures (including critical care time)  Labs Review Labs Reviewed  POCT URINALYSIS DIP (DEVICE) - Abnormal; Notable for the following:       Result Value   Hgb urine dipstick TRACE (*)    All other components within normal limits    Imaging Review No results found.   Patient reports improvement with injections   MDM   1. Acute bilateral low back pain without sciatica      Jacqualine Mau, NP 06/04/16 1819

## 2017-03-02 IMAGING — US US PELVIS COMPLETE
1 series · 15 of 25 positions shown · non-contrast
Comparison: None

CLINICAL DATA: Uterine prolapse.

EXAM:
TRANSABDOMINAL AND TRANSVAGINAL ULTRASOUND OF PELVIS
TECHNIQUE: Both transabdominal and transvaginal ultrasound examinations of the
pelvis were performed. Transabdominal technique was performed for
global imaging of the pelvis including uterus, ovaries, adnexal
regions, and pelvic cul-de-sac. It was necessary to proceed with
endovaginal exam following the transabdominal exam to visualize the
uterus and ovaries.

[Series 1: us pelvis complete · 81 acquisitions, 15 frames shown]
[im 1/81]
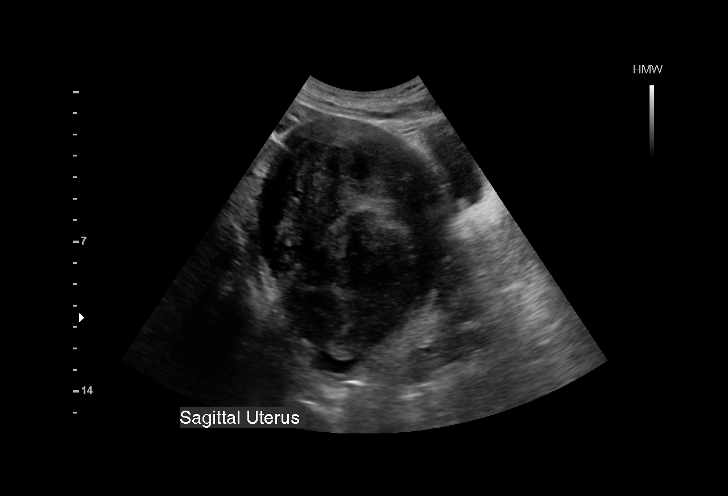
[im 7/81]
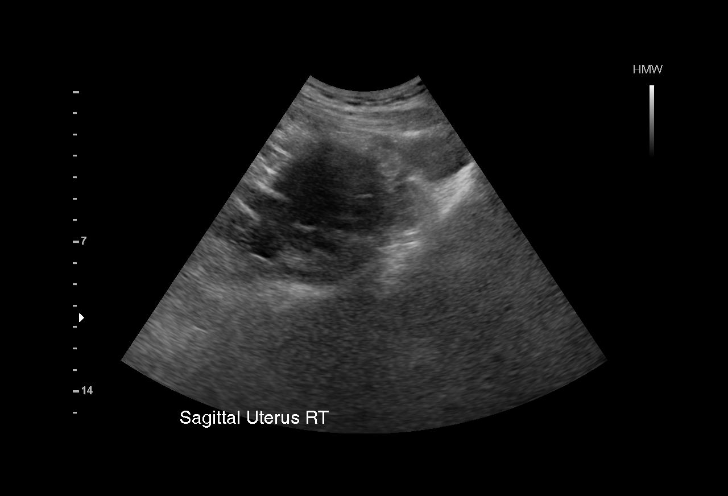
[im 14/81]
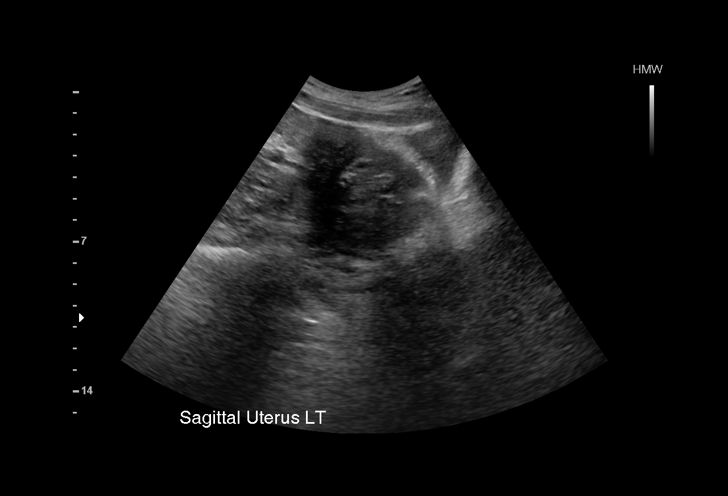
[im 17/81]
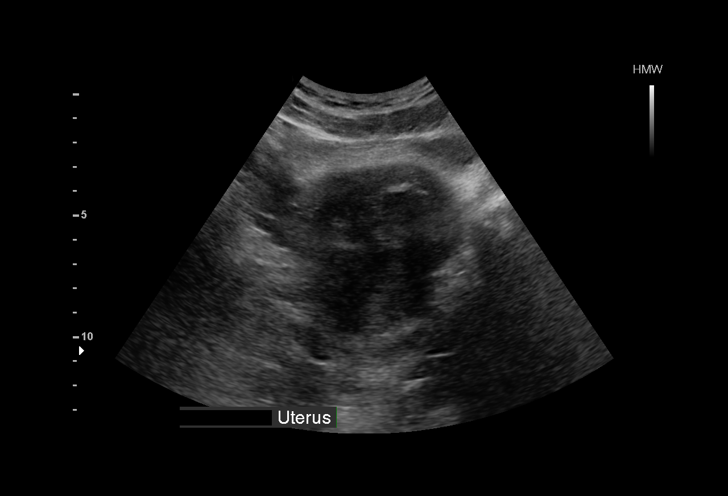
[im 24/81]
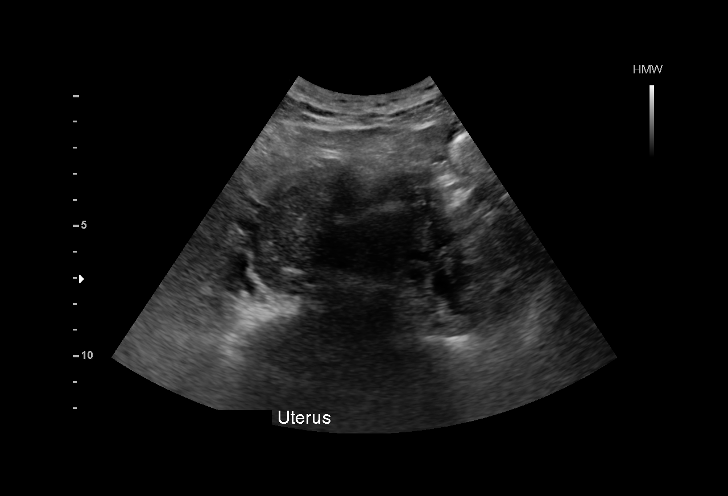
[im 31/81]
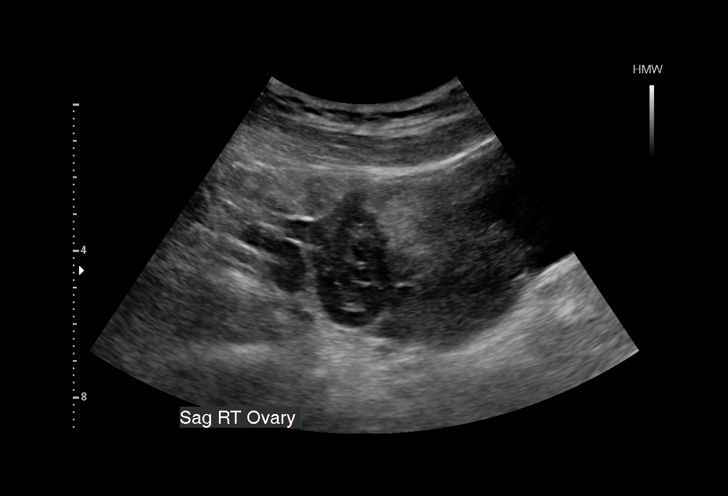
[im 34/81]
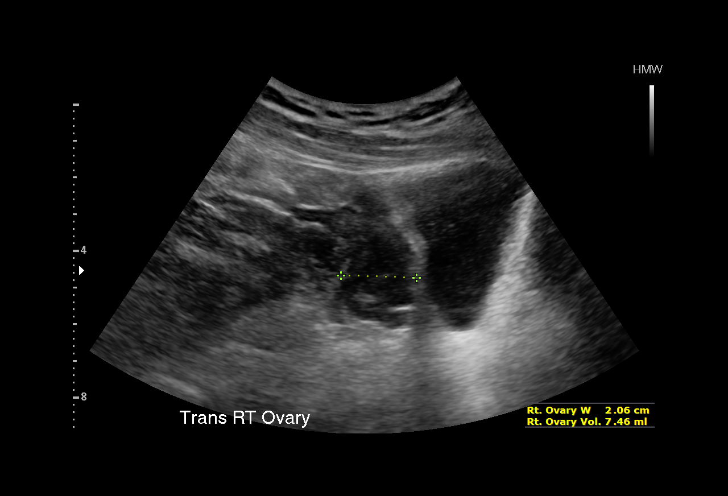
[im 41/81]
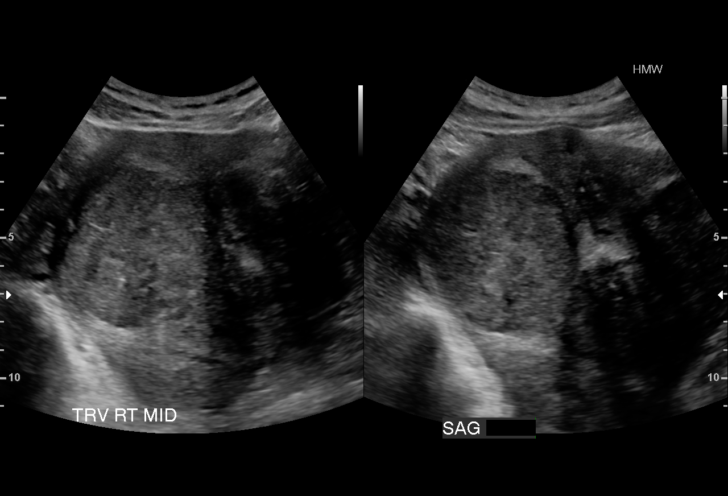
[im 47/81]
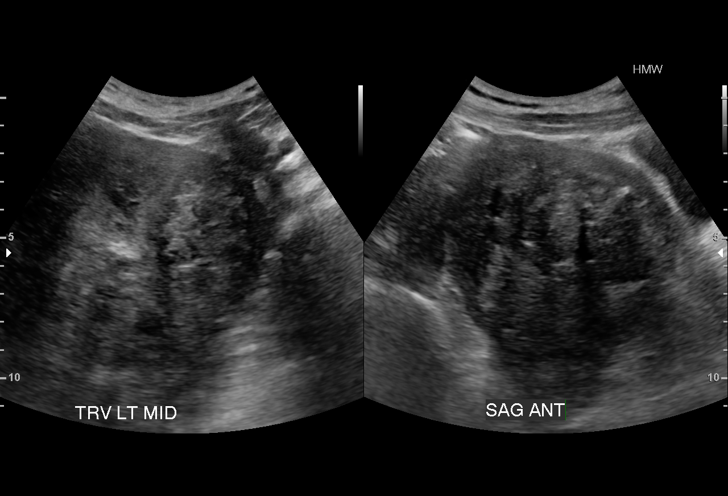
[im 51/81]
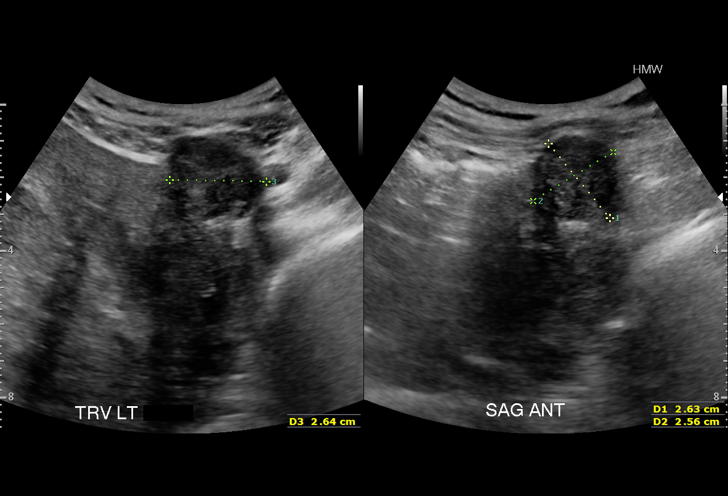
[im 57/81]
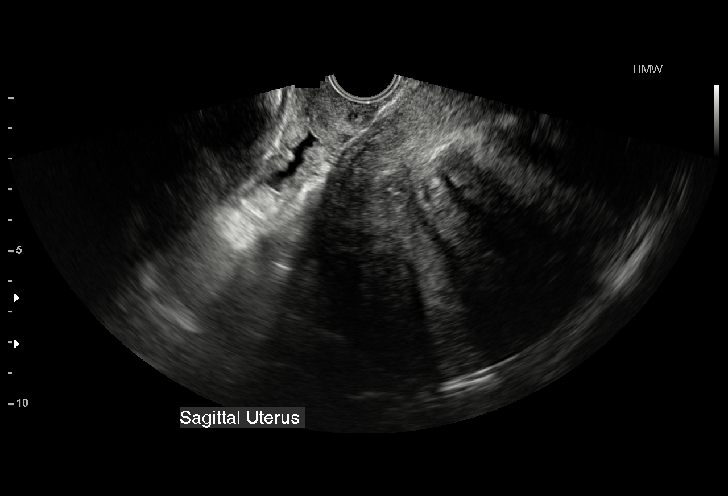
[im 64/81]
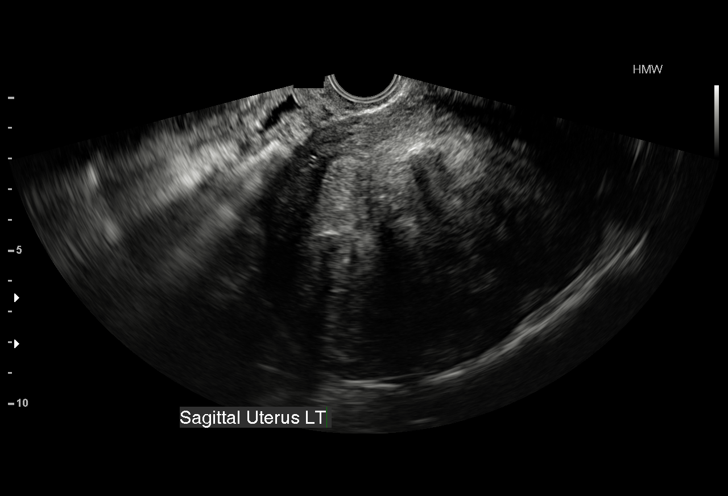
[im 67/81]
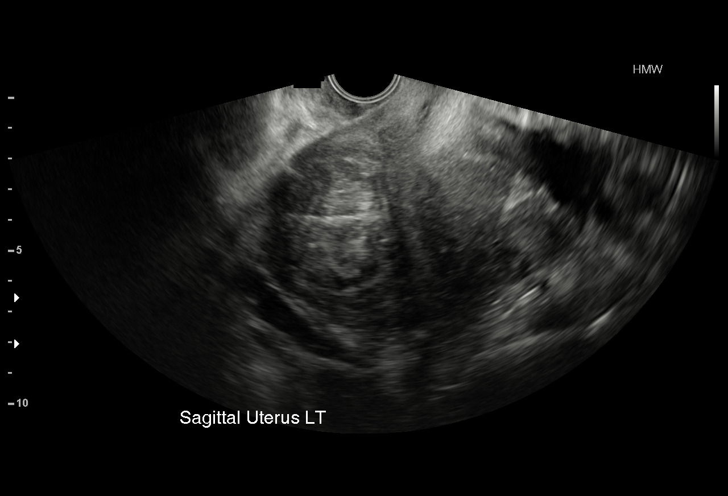
[im 74/81]
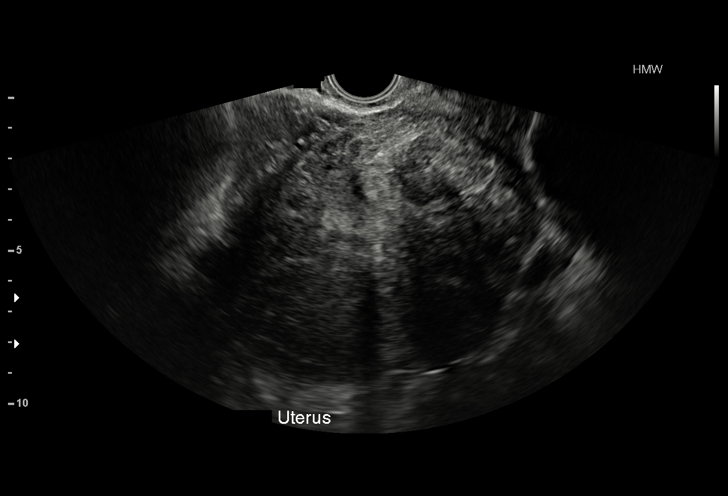
[im 81/81]
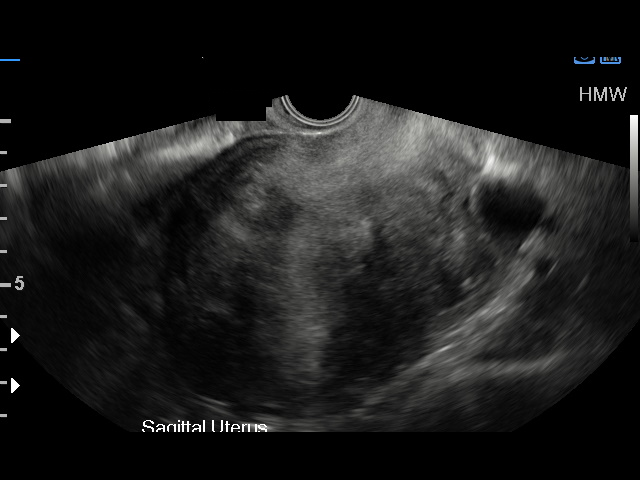

[15 of 25 positions shown; findings below may reference images not displayed]

FINDINGS: Uterus

Measurements: 13.3 x 9.0 x 11 0.4 seconds. Multiple mass lesions
noted throughout the uterus consistent with fibroids. The largest is
in the right body and measures 6.4 cm.

Endometrium

Thickness: 10 mm.  No focal abnormality visualized.

Right ovary

Measurements: 3.3 x 2.1 x 2.1 cm. Normal appearance/no adnexal mass.

Left ovary

Measurements: 3.2 x 1.7 x 2.0 cm. Normal appearance/no adnexal mass.

Other findings

Trace free pelvic fluid.
IMPRESSION: Fibroid uterus.  Trace free pelvic fluid.

## 2017-07-24 ENCOUNTER — Encounter (HOSPITAL_COMMUNITY): Payer: Self-pay | Admitting: Emergency Medicine

## 2017-07-24 ENCOUNTER — Ambulatory Visit (HOSPITAL_COMMUNITY)
Admission: EM | Admit: 2017-07-24 | Discharge: 2017-07-24 | Disposition: A | Discharge status: Home / Self Care | Payer: BLUE CROSS/BLUE SHIELD | Attending: Urgent Care | Admitting: Urgent Care

## 2017-07-24 ENCOUNTER — Emergency Department (HOSPITAL_COMMUNITY)
Admission: EM | Admit: 2017-07-24 | Discharge: 2017-07-24 | Disposition: A | Discharge status: Home / Self Care | Payer: BLUE CROSS/BLUE SHIELD | Attending: Emergency Medicine | Admitting: Emergency Medicine

## 2017-07-24 ENCOUNTER — Other Ambulatory Visit: Payer: Self-pay

## 2017-07-24 DIAGNOSIS — L509 Urticaria, unspecified: Secondary | ICD-10-CM

## 2017-07-24 DIAGNOSIS — R030 Elevated blood-pressure reading, without diagnosis of hypertension: Secondary | ICD-10-CM

## 2017-07-24 DIAGNOSIS — L299 Pruritus, unspecified: Secondary | ICD-10-CM

## 2017-07-24 DIAGNOSIS — I10 Essential (primary) hypertension: Secondary | ICD-10-CM

## 2017-07-24 DIAGNOSIS — Z5321 Procedure and treatment not carried out due to patient leaving prior to being seen by health care provider: Secondary | ICD-10-CM | POA: Diagnosis not present

## 2017-07-24 DIAGNOSIS — L298 Other pruritus: Secondary | ICD-10-CM

## 2017-07-24 MED ORDER — AMLODIPINE BESYLATE 5 MG PO TABS: 5.0000 mg | ORAL_TABLET | Freq: Every day | ORAL | 0 refills | Status: DC

## 2017-07-24 MED ORDER — HYDROXYZINE HCL 25 MG PO TABS: 25.0000 mg | ORAL_TABLET | Freq: Every evening | ORAL | 0 refills | Status: DC | PRN

## 2017-07-24 MED ORDER — METHYLPREDNISOLONE ACETATE 80 MG/ML IJ SUSP
INTRAMUSCULAR | Status: AC
  Filled 2017-07-24: qty 1

## 2017-07-24 MED ORDER — METHYLPREDNISOLONE ACETATE 80 MG/ML IJ SUSP
80.0000 mg | Freq: Once | INTRAMUSCULAR | Status: AC
  Administered 2017-07-24: 80 mg via INTRAMUSCULAR

## 2017-07-24 NOTE — Discharge Instructions (Addendum)
Jaynee Eagles, PA-C [facility]

## 2017-07-24 NOTE — ED Notes (Signed)
Called pt's name to obtain vitals, no answer.

## 2017-07-24 NOTE — ED Triage Notes (Signed)
C/o rash on arms and legs onset this AM

## 2017-07-24 NOTE — ED Triage Notes (Signed)
Pt reports ongoing allergic reaction with hives, states this time she began breaking out on Friday, pt has hives to legs, arms and torso. Denies any sob, no swelling to face or mouth. resp e/u.

## 2017-07-24 NOTE — ED Provider Notes (Signed)
  MRN: [id number] DOB: 04-13-1975  Subjective:   Toni Valencia is a 42 y.o. female presenting for persistent and recurring hives over her limbs torso and back.  Patient has had this problem ongoing for the past few weeks.  She cannot identify any offending factors as she uses stuffer gentle skin including soaps and detergents, hygiene products.  She also has not started eating any new foods or started any new medications.  She admits that she has been told she has high blood pressure before but is not taking any medications for this. Denies dizziness, chronic headache, blurred vision, chest pain, shortness of breath, heart racing, palpitations, nausea, vomiting, abdominal pain, hematuria, lower leg swelling.   No current facility-administered medications for this encounter.   Current Outpatient Medications:  .  ibuprofen (ADVIL,MOTRIN) 200 MG tablet, Take 400 mg by mouth every 6 (six) hours as needed for headache, mild pain or moderate pain., Disp: , Rfl:    No Known Allergies  Past Medical History:  Diagnosis Date  . Anemia   . Strep throat      Past Surgical History:  Procedure Laterality Date  . ABDOMINAL HYSTERECTOMY    . APPENDECTOMY      Objective:   Vitals: BP (!) 168/120 (BP Location: Right Arm)   Pulse 90   Temp 98.3 F (36.8 C) (Oral)   LMP 05/04/2015 Comment: light bleeding for a month, became heavy today  SpO2 100%   BP Readings from Last 3 Encounters:  07/24/17 (!) 168/120  06/04/16 159/89  05/21/15 134/90    Physical Exam  Constitutional: She is oriented to person, place, and time. She appears well-developed and well-nourished.  HENT:  Mouth/Throat: Oropharynx is clear and moist.  Eyes: Pupils are equal, round, and reactive to light. EOM are normal. Right eye exhibits no discharge. Left eye exhibits no discharge.  Cardiovascular: Normal rate, regular rhythm and intact distal pulses. Exam reveals no gallop and no friction rub.  No murmur  heard. Pulmonary/Chest: No respiratory distress. She has no wheezes. She has no rales.  Musculoskeletal: She exhibits no edema.  Neurological: She is alert and oriented to person, place, and time.  Skin: Skin is warm and dry.  Diffusely scattered urticarial lesions over her limbs torso and back sparing the face and genital area.  Psychiatric: She has a normal mood and affect.    Assessment and Plan :   Urticaria  Itching  Essential hypertension  Elevated blood pressure reading  Patient has been using Benadryl very consistently with very short-term relief only.  Counseled that she will need a consult with an allergist.  Today we will use IM Depo-Medrol.  She is also to start blood pressure management with 5 mg of amlodipine.  She agreed to establish care with me at primary care [place]. Counseled patient on potential for adverse effects with medications prescribed today, patient verbalized understanding.      Jaynee Eagles, PA-C 07/24/17 1147

## 2017-07-24 NOTE — ED Notes (Signed)
Received phone call from [facility] that patient was at their facility and requesting we d/c from our system.

## 2017-07-25 ENCOUNTER — Ambulatory Visit: Payer: BLUE CROSS/BLUE SHIELD | Admitting: Urgent Care

## 2017-07-25 ENCOUNTER — Encounter: Payer: Self-pay | Admitting: Urgent Care

## 2017-07-25 VITALS — BP 150/120 | HR 90 | Temp 98.4°F | Resp 16 | Ht 69.0 in | Wt 194.4 lb

## 2017-07-25 DIAGNOSIS — R030 Elevated blood-pressure reading, without diagnosis of hypertension: Secondary | ICD-10-CM | POA: Diagnosis not present

## 2017-07-25 DIAGNOSIS — Z833 Family history of diabetes mellitus: Secondary | ICD-10-CM | POA: Diagnosis not present

## 2017-07-25 DIAGNOSIS — I10 Essential (primary) hypertension: Secondary | ICD-10-CM

## 2017-07-25 DIAGNOSIS — Z114 Encounter for screening for human immunodeficiency virus [HIV]: Secondary | ICD-10-CM

## 2017-07-25 MED ORDER — SPHYGMOMANOMETER MISC: 1.0000 [IU] | 0 refills | Status: DC

## 2017-07-25 MED ORDER — LISINOPRIL 20 MG PO TABS: 10.0000 mg | ORAL_TABLET | Freq: Every day | ORAL | 0 refills | Status: DC

## 2017-07-25 NOTE — Progress Notes (Signed)
    MRN: [id number] DOB: 08/12/1975  Subjective:   Toni Valencia is a 42 y.o. female presenting for follow up on HTN. Denies dizziness, chronic headache, blurred vision, chest pain, shortness of breath, heart racing, palpitations, nausea, vomiting, abdominal pain, hematuria, lower leg swelling.  Patient does smoke cigarettes and drink alcohol on the weekends.  She also eats fast food.  Patient was seen by me yesterday at the urgent care clinic and started on blood pressure medicine.  She is taking amlodipine 5 mg.  Her mother has a history of hypertension and diabetes.  She is agreeable to adding blood pressure medications.  Rachell has a current medication list which includes the following prescription(s): amlodipine, hydroxyzine, and ibuprofen. Also has No Known Allergies.  Abrish  has a past medical history of Anemia and Strep throat. Also  has a past surgical history that includes Appendectomy and Abdominal hysterectomy.  Objective:   Vitals: BP (!) 150/120   Pulse 90   Temp 98.4 F (36.9 C) (Oral)   Resp 16   Ht 5\' 9"  (1.753 m)   Wt 194 lb 6.4 oz (88.2 kg)   LMP 05/04/2015 Comment: light bleeding for a month, became heavy today  SpO2 99%   BMI 28.71 kg/m   BP Readings from Last 3 Encounters:  07/25/17 (!) 150/120  07/24/17 (!) 168/120  06/04/16 159/89    Physical Exam  Constitutional: She is oriented to person, place, and time. She appears well-developed and well-nourished.  HENT:  Mouth/Throat: Oropharynx is clear and moist.  Eyes: Pupils are equal, round, and reactive to light. EOM are normal. No scleral icterus.  Neck: Normal range of motion. Neck supple. No thyromegaly present.  Cardiovascular: Normal rate, regular rhythm and intact distal pulses. Exam reveals no gallop and no friction rub.  No murmur heard. Pulmonary/Chest: No respiratory distress. She has no wheezes. She has no rales.  Musculoskeletal: She exhibits no edema.  Neurological: She is alert and  oriented to person, place, and time.  Skin: Skin is warm and dry.  Psychiatric: She has a normal mood and affect.   Assessment and Plan :   Essential hypertension - Plan: Comprehensive metabolic panel, Lipid panel, Microalbumin / creatinine urine ratio  Elevated blood pressure reading  Screening for HIV (human immunodeficiency virus) - Plan: HIV antibody  Family history of diabetes mellitus - Plan: Hemoglobin A1c  Labs pending, patient is to start lifestyle changes including healthy diet and exercise.  Titration instructions given for amlodipine and lisinopril. Counseled patient on potential for adverse effects with medications prescribed today, patient verbalized understanding. Return-to-clinic precautions discussed, patient verbalized understanding.  Follow-up in 4 weeks  Jaynee Eagles, [facility] [phone] 07/25/2017  11:41 AM

## 2017-07-25 NOTE — Patient Instructions (Addendum)
In 1 week, increase your amlodipine dose from 5mg  to 10mg . This means you will take 2 pills of amlodipine daily. In 2 weeks, start taking lisinopril once daily. You will start at 1/2 tablet. Check your blood pressure once weekly. If it remains above 272'Z systolic in 2-4 weeks, then increase your lisinopril dose to 1 full tablet daily.   Please try to obtain a blood pressure cuff from [facility] supply or another medical supply store.   Avoid salt in your food. For seasoning make sure you check the sodium label or that it states it is sodium free. Using Mrs. Deliah Boston is a viable option.   Salads - kale, spinach, cabbage, spring mix; use seeds like pumpkin seeds or sunflower seeds; you can also use 1-2 hard boiled eggs. Fruits - avocadoes, berries (blueberries, raspberries, blackberries), apples, oranges, pomegranate, grapefruit Seeds - quinoa, chia seeds, flax seeds; you can also incorporate oatmeal Vegetables - aspargus, cauliflower, broccoli, green beans, brussel spouts, bell peppers; stay away from starchy vegetables like potatoes, carrots, peas  Brussel sprouts - Cut off stems. Place in a mixing bowl that has a lid. Pour in a 1/4-1/2 cup olive oil, spices, use a light amount of parmesan. Place on a baking sheet. Bake for 10 minutes at 400F. Take it out, eat the brussel chips. Place for another 5-10 minutes.   Mashed cauliflower - Boil a bunch of cauliflower in a pot of water. Blend in a food processor with 1-2 tablespoons of butter.  Spaghetti squash -  Cut the squash in half very carefully, clean out seeds from the middle. Place 1/2 face down in a microwave safe dish with at least 2 inches of water. Make 4-6 slits on outside of spaghetti squash and microwave for 10-12 minutes. Take out the spaghetti using a metal spoon. Repeat for the other half.   Vega protein is good protein powder, make sure you use ~6 ice cubes to give it smoothie consistency together with ~4-6 ounces of vanilla soy  milk. Throw cinnamon into your shake, use peanut butter. You can also use the fruits that I listed above. Throw spinach or kale into the shake.     Hypertension Hypertension, commonly called high blood pressure, is when the force of blood pumping through the arteries is too strong. The arteries are the blood vessels that carry blood from the heart throughout the body. Hypertension forces the heart to work harder to pump blood and may cause arteries to become narrow or stiff. Having untreated or uncontrolled hypertension can cause heart attacks, strokes, kidney disease, and other problems. A blood pressure reading consists of a higher number over a lower number. Ideally, your blood pressure should be below 120/80. The first ("top") number is called the systolic pressure. It is a measure of the pressure in your arteries as your heart beats. The second ("bottom") number is called the diastolic pressure. It is a measure of the pressure in your arteries as the heart relaxes. What are the causes? The cause of this condition is not known. What increases the risk? Some risk factors for high blood pressure are under your control. Others are not. Factors you can change  Smoking.  Having type 2 diabetes mellitus, high cholesterol, or both.  Not getting enough exercise or physical activity.  Being overweight.  Having too much fat, sugar, calories, or salt (sodium) in your diet.  Drinking too much alcohol. Factors that are difficult or impossible to change  Having chronic kidney disease.  Having  a family history of high blood pressure.  Age. Risk increases with age.  Race. You may be at higher risk if you are African-American.  Gender. Men are at higher risk than women before age 89. After age 49, women are at higher risk than men.  Having obstructive sleep apnea.  Stress. What are the signs or symptoms? Extremely high blood pressure (hypertensive crisis) may  cause:  Headache.  Anxiety.  Shortness of breath.  Nosebleed.  Nausea and vomiting.  Severe chest pain.  Jerky movements you cannot control (seizures).  How is this diagnosed? This condition is diagnosed by measuring your blood pressure while you are seated, with your arm resting on a surface. The cuff of the blood pressure monitor will be placed directly against the skin of your upper arm at the level of your heart. It should be measured at least twice using the same arm. Certain conditions can cause a difference in blood pressure between your right and left arms. Certain factors can cause blood pressure readings to be lower or higher than normal (elevated) for a short period of time:  When your blood pressure is higher when you are in a health care provider's office than when you are at home, this is called white coat hypertension. Most people with this condition do not need medicines.  When your blood pressure is higher at home than when you are in a health care provider's office, this is called masked hypertension. Most people with this condition may need medicines to control blood pressure.  If you have a high blood pressure reading during one visit or you have normal blood pressure with other risk factors:  You may be asked to return on a different day to have your blood pressure checked again.  You may be asked to monitor your blood pressure at home for 1 week or longer.  If you are diagnosed with hypertension, you may have other blood or imaging tests to help your health care provider understand your overall risk for other conditions. How is this treated? This condition is treated by making healthy lifestyle changes, such as eating healthy foods, exercising more, and reducing your alcohol intake. Your health care provider may prescribe medicine if lifestyle changes are not enough to get your blood pressure under control, and if:  Your systolic blood pressure is above  130.  Your diastolic blood pressure is above 80.  Your personal target blood pressure may vary depending on your medical conditions, your age, and other factors. Follow these instructions at home: Eating and drinking  Eat a diet that is high in fiber and potassium, and low in sodium, added sugar, and fat. An example eating plan is called the DASH (Dietary Approaches to Stop Hypertension) diet. To eat this way: ? Eat plenty of fresh fruits and vegetables. Try to fill half of your plate at each meal with fruits and vegetables. ? Eat whole grains, such as whole wheat pasta, brown rice, or whole grain bread. Fill about one quarter of your plate with whole grains. ? Eat or drink low-fat dairy products, such as skim milk or low-fat yogurt. ? Avoid fatty cuts of meat, processed or cured meats, and poultry with skin. Fill about one quarter of your plate with lean proteins, such as fish, chicken without skin, beans, eggs, and tofu. ? Avoid premade and processed foods. These tend to be higher in sodium, added sugar, and fat.  Reduce your daily sodium intake. Most people with hypertension should eat less  than 1,500 mg of sodium a day.  Limit alcohol intake to no more than 1 drink a day for nonpregnant women and 2 drinks a day for men. One drink equals 12 oz of beer, 5 oz of wine, or 1 oz of hard liquor. Lifestyle  Work with your health care provider to maintain a healthy body weight or to lose weight. Ask what an ideal weight is for you.  Get at least 30 minutes of exercise that causes your heart to beat faster (aerobic exercise) most days of the week. Activities may include walking, swimming, or biking.  Include exercise to strengthen your muscles (resistance exercise), such as pilates or lifting weights, as part of your weekly exercise routine. Try to do these types of exercises for 30 minutes at least 3 days a week.  Do not use any products that contain nicotine or tobacco, such as cigarettes and  e-cigarettes. If you need help quitting, ask your health care provider.  Monitor your blood pressure at home as told by your health care provider.  Keep all follow-up visits as told by your health care provider. This is important. Medicines  Take over-the-counter and prescription medicines only as told by your health care provider. Follow directions carefully. Blood pressure medicines must be taken as prescribed.  Do not skip doses of blood pressure medicine. Doing this puts you at risk for problems and can make the medicine less effective.  Ask your health care provider about side effects or reactions to medicines that you should watch for. Contact a health care provider if:  You think you are having a reaction to a medicine you are taking.  You have headaches that keep coming back (recurring).  You feel dizzy.  You have swelling in your ankles.  You have trouble with your vision. Get help right away if:  You develop a severe headache or confusion.  You have unusual weakness or numbness.  You feel faint.  You have severe pain in your chest or abdomen.  You vomit repeatedly.  You have trouble breathing. Summary  Hypertension is when the force of blood pumping through your arteries is too strong. If this condition is not controlled, it may put you at risk for serious complications.  Your personal target blood pressure may vary depending on your medical conditions, your age, and other factors. For most people, a normal blood pressure is less than 120/80.  Hypertension is treated with lifestyle changes, medicines, or a combination of both. Lifestyle changes include weight loss, eating a healthy, low-sodium diet, exercising more, and limiting alcohol. This information is not intended to replace advice given to you by your health care provider. Make sure you discuss any questions you have with your health care provider. Document Released: 03/14/2005 Document Revised: 02/10/2016  Document Reviewed: 02/10/2016 Elsevier Interactive Patient Education  2018 [employer].     IF you received an x-ray today, you will receive an invoice from [facility]. Please contact [facility] at [phone] with questions or concerns regarding your invoice.   IF you received labwork today, you will receive an invoice from [facility]. Please contact [facility] at [phone] with questions or concerns regarding your invoice.   Our billing staff will not be able to assist you with questions regarding bills from these companies.  You will be contacted with the lab results as soon as they are available. The fastest way to get your results is to activate your My Chart account. Instructions are located on the last page  of this paperwork. If you have not heard from [place] regarding the results in 2 weeks, please contact this office.

## 2017-07-26 LAB — HIV ANTIBODY (ROUTINE TESTING W REFLEX): HIV SCREEN 4TH GENERATION: NONREACTIVE

## 2017-07-26 LAB — HEMOGLOBIN A1C
Est. average glucose Bld gHb Est-mCnc: 105 mg/dL
Hgb A1c MFr Bld: 5.3 % (ref 4.8–5.6)

## 2017-07-26 LAB — LIPID PANEL
Chol/HDL Ratio: 3.1 ratio (ref 0.0–4.4)
Cholesterol, Total: 215 mg/dL — ABNORMAL HIGH (ref 100–199)
HDL: 69 mg/dL (ref >39)
LDL CALC: 126 mg/dL — AB (ref 0–99)
Triglycerides: 98 mg/dL (ref 0–149)
VLDL CHOLESTEROL CAL: 20 mg/dL (ref 5–40)

## 2017-07-26 LAB — COMPREHENSIVE METABOLIC PANEL
ALBUMIN: 4.6 g/dL (ref 3.5–5.5)
ALT: 17 IU/L (ref 0–32)
AST: 17 IU/L (ref 0–40)
Albumin/Globulin Ratio: 1.5 (ref 1.2–2.2)
Alkaline Phosphatase: 85 IU/L (ref 39–117)
BUN / CREAT RATIO: 13 (ref 9–23)
BUN: 12 mg/dL (ref 6–24)
Bilirubin Total: 0.5 mg/dL (ref 0.0–1.2)
CO2: 20 mmol/L (ref 20–29)
CREATININE: 0.96 mg/dL (ref 0.57–1.00)
Calcium: 9.7 mg/dL (ref 8.7–10.2)
Chloride: 104 mmol/L (ref 96–106)
GFR, EST AFRICAN AMERICAN: 84 mL/min/{1.73_m2} (ref >59)
GFR, EST NON AFRICAN AMERICAN: 73 mL/min/{1.73_m2} (ref >59)
GLUCOSE: 95 mg/dL (ref 65–99)
Globulin, Total: 3.1 g/dL (ref 1.5–4.5)
Potassium: 3.9 mmol/L (ref 3.5–5.2)
Sodium: 142 mmol/L (ref 134–144)
TOTAL PROTEIN: 7.7 g/dL (ref 6.0–8.5)

## 2017-07-26 LAB — MICROALBUMIN / CREATININE URINE RATIO
CREATININE, UR: 218.5 mg/dL
MICROALBUM., U, RANDOM: 12.2 ug/mL
Microalb/Creat Ratio: 5.6 mg/g creat (ref 0.0–30.0)

## 2017-08-05 ENCOUNTER — Telehealth: Payer: Self-pay | Admitting: Urgent Care

## 2017-08-05 NOTE — Telephone Encounter (Signed)
Patient needs an office visit for this.  Please schedule ASAP.

## 2017-08-05 NOTE — Telephone Encounter (Signed)
Pt. Believes she had an allergic reaction to lisinopril. Pt. Wishes to be advised on what to do  Best Number [phone]

## 2017-08-05 NOTE — Telephone Encounter (Signed)
Phone call to patient to discuss symptoms of lip swelling. She believes this is related to starting lisinopril on Tuesday.   Lip swelling noticed yesterday.   Denies SOB, chest pain, dizziness, nausea, vomiting, diarrhea, issues swallowing.   Hives onset yesterday, went away with benadryl. Took benadryl 25mg  last night and 50mg  this morning.   Usually takes lisinopril in the morning, she takes a 1/2 tablet.   No changes in diet, skincare, laundry detergents.   Patient advised message will be sent to provider, she might be switched to different medication. Patient states, "He told me I didn't have to take it (lisinopril), it was up to me."  Pharmacy confirmed.   Patient advised to hold lisinopril for now, she verbalizes understanding.   Provider, please advise.

## 2017-08-24 ENCOUNTER — Ambulatory Visit: Payer: BLUE CROSS/BLUE SHIELD | Admitting: Urgent Care

## 2017-08-24 ENCOUNTER — Encounter: Payer: Self-pay | Admitting: Urgent Care

## 2017-08-24 VITALS — BP 118/82 | HR 93 | Temp 98.1°F | Resp 18 | Ht 69.0 in | Wt 193.6 lb

## 2017-08-24 DIAGNOSIS — T783XXA Angioneurotic edema, initial encounter: Secondary | ICD-10-CM

## 2017-08-24 DIAGNOSIS — R030 Elevated blood-pressure reading, without diagnosis of hypertension: Secondary | ICD-10-CM

## 2017-08-24 DIAGNOSIS — I10 Essential (primary) hypertension: Secondary | ICD-10-CM | POA: Diagnosis not present

## 2017-08-24 MED ORDER — AMLODIPINE BESYLATE 5 MG PO TABS: 5.0000 mg | ORAL_TABLET | Freq: Two times a day (BID) | ORAL | 3 refills | Status: DC

## 2017-08-24 NOTE — Progress Notes (Signed)
    MRN: [id number] DOB: [birth year]  Subjective:   Toni Valencia is a 42 y.o. female presenting for follow up on Hypertension. Currently managed with amlodipine 5 mg twice daily. Patient is checking blood pressure at home, generally 945'W-388'E systolic. Avoids salt in diet, is exercising. Reports she had swelling of her upper lip on May 11 that resolved with stopping lisinopril and taking Benadryl.  Patient reports that her symptoms were completely gone by May 12. Denies dizziness, chronic headache, blurred vision, chest pain, shortness of breath, heart racing, palpitations, nausea, vomiting, abdominal pain, hematuria, lower leg swelling. Denies smoking cigarettes.   Toni Valencia has a current medication list which includes the following prescription(s): amlodipine, sphygmomanometer, hydroxyzine, and ibuprofen. Also is allergic to lisinopril.  Toni Valencia  has a past medical history of Anemia and Strep throat. Also  has a past surgical history that includes Appendectomy and Abdominal hysterectomy.  Objective:   Vitals: BP 118/82 (BP Location: Right Arm, Patient Position: Sitting, Cuff Size: Normal)   Pulse 93   Temp 98.1 F (36.7 C) (Oral)   Resp 18   Ht 5\' 9"  (1.753 m)   Wt 193 lb 9.6 oz (87.8 kg)   LMP 05/04/2015 Comment: light bleeding for a month, became heavy today  SpO2 100%   BMI 28.59 kg/m   BP Readings from Last 3 Encounters:  08/24/17 118/82  07/25/17 (!) 150/120  07/24/17 (!) 168/120    Wt Readings from Last 3 Encounters:  08/24/17 193 lb 9.6 oz (87.8 kg)  07/25/17 194 lb 6.4 oz (88.2 kg)  03/13/15 157 lb (71.2 kg)    The 10-year ASCVD risk score Mikey Bussing DC Jr., et al., 2013) is: 0.9%   Values used to calculate the score:     Age: 55 years     Sex: Female     Is Non-Hispanic African American: Yes     Diabetic: No     Tobacco smoker: No     Systolic Blood Pressure: 280 mmHg     Is BP treated: Yes     HDL Cholesterol: 69 mg/dL     Total Cholesterol: 215 mg/dL    Physical Exam  Constitutional: She is oriented to person, place, and time. She appears well-developed and well-nourished.  Cardiovascular: Normal rate.  Pulmonary/Chest: Effort normal.  Neurological: She is alert and oriented to person, place, and time.   Assessment and Plan :   Essential hypertension  Elevated blood pressure reading  Angioedema of lips, initial encounter  Patient is doing much better.  Monitoring parameters for her blood pressure we given including titration instructions.  She is to follow-up in 6 months for recheck or sooner as discussed in clinic.  Jaynee Eagles, PA-C [facility] [phone] 08/24/2017  3:48 PM

## 2017-08-24 NOTE — Patient Instructions (Addendum)
Please check your blood pressure once weekly. Try to check the blood pressure around the same time each day that you check it after a period of resting in a seated position for 5-10 minutes. The readings should be between 416-606 systolic (top number), between 30-16 diastolic (bottom number). If your blood pressure falls outside this range consistently (i.e. 3-4 weeks in a row) on low end, then decrease your amlodipine dose from 5mg  twice daily to once daily. If it is on the high end, then come back for a recheck.    Salads - kale, spinach, cabbage, spring mix; use seeds like pumpkin seeds or sunflower seeds; you can also use 1-2 hard boiled eggs. Fruits - avocadoes, berries (blueberries, raspberries, blackberries), apples, oranges, pomegranate, grapefruit Seeds - quinoa, chia seeds; you can also incorporate oatmeal Vegetables - aspargus, cauliflower, broccoli, green beans, brussel spouts, bell peppers; stay away from starchy vegetables like potatoes, carrots, peas  Brussel sprouts - Cut off stems. Place in a mixing bowl that has a lid. Pour in a 1/4-1/2 cup olive oil, spices, use a light amount of parmesan. Place on a baking sheet. Bake for 10 minutes at 400F. Take it out, eat the brussel chips. Place for another 5-10 minutes.   Mashed cauliflower - Boil a bunch of cauliflower in a pot of water. Blend in a food processor with 1-2 tablespoons of butter.  Spaghetti squash -  Cut the squash in half very carefully, clean out seeds from the middle. Place 1/2 face down in a microwave safe dish with at least 2 inches of water. Make 4-6 slits on outside of spaghetti squash and microwave for 10-12 minutes. Take out the spaghetti using a metal spoon. Repeat for the other half.   Vega protein is good protein powder, make sure you use ~6 ice cubes to give it smoothie consistency together with ~4-6 ounces of vanilla soy milk. Throw cinnamon into your shake, use peanut butter. You can also use the fruits that I  listed above. Throw spinach or kale into the shake.      Hypertension Hypertension, commonly called high blood pressure, is when the force of blood pumping through the arteries is too strong. The arteries are the blood vessels that carry blood from the heart throughout the body. Hypertension forces the heart to work harder to pump blood and may cause arteries to become narrow or stiff. Having untreated or uncontrolled hypertension can cause heart attacks, strokes, kidney disease, and other problems. A blood pressure reading consists of a higher number over a lower number. Ideally, your blood pressure should be below 120/80. The first ("top") number is called the systolic pressure. It is a measure of the pressure in your arteries as your heart beats. The second ("bottom") number is called the diastolic pressure. It is a measure of the pressure in your arteries as the heart relaxes. What are the causes? The cause of this condition is not known. What increases the risk? Some risk factors for high blood pressure are under your control. Others are not. Factors you can change  Smoking.  Having type 2 diabetes mellitus, high cholesterol, or both.  Not getting enough exercise or physical activity.  Being overweight.  Having too much fat, sugar, calories, or salt (sodium) in your diet.  Drinking too much alcohol. Factors that are difficult or impossible to change  Having chronic kidney disease.  Having a family history of high blood pressure.  Age. Risk increases with age.  Race. You may be  at higher risk if you are African-American.  Gender. Men are at higher risk than women before age 58. After age 98, women are at higher risk than men.  Having obstructive sleep apnea.  Stress. What are the signs or symptoms? Extremely high blood pressure (hypertensive crisis) may cause:  Headache.  Anxiety.  Shortness of breath.  Nosebleed.  Nausea and vomiting.  Severe chest  pain.  Jerky movements you cannot control (seizures).  How is this diagnosed? This condition is diagnosed by measuring your blood pressure while you are seated, with your arm resting on a surface. The cuff of the blood pressure monitor will be placed directly against the skin of your upper arm at the level of your heart. It should be measured at least twice using the same arm. Certain conditions can cause a difference in blood pressure between your right and left arms. Certain factors can cause blood pressure readings to be lower or higher than normal (elevated) for a short period of time:  When your blood pressure is higher when you are in a health care provider's office than when you are at home, this is called white coat hypertension. Most people with this condition do not need medicines.  When your blood pressure is higher at home than when you are in a health care provider's office, this is called masked hypertension. Most people with this condition may need medicines to control blood pressure.  If you have a high blood pressure reading during one visit or you have normal blood pressure with other risk factors:  You may be asked to return on a different day to have your blood pressure checked again.  You may be asked to monitor your blood pressure at home for 1 week or longer.  If you are diagnosed with hypertension, you may have other blood or imaging tests to help your health care provider understand your overall risk for other conditions. How is this treated? This condition is treated by making healthy lifestyle changes, such as eating healthy foods, exercising more, and reducing your alcohol intake. Your health care provider may prescribe medicine if lifestyle changes are not enough to get your blood pressure under control, and if:  Your systolic blood pressure is above 130.  Your diastolic blood pressure is above 80.  Your personal target blood pressure may vary depending on your  medical conditions, your age, and other factors. Follow these instructions at home: Eating and drinking  Eat a diet that is high in fiber and potassium, and low in sodium, added sugar, and fat. An example eating plan is called the DASH (Dietary Approaches to Stop Hypertension) diet. To eat this way: ? Eat plenty of fresh fruits and vegetables. Try to fill half of your plate at each meal with fruits and vegetables. ? Eat whole grains, such as whole wheat pasta, brown rice, or whole grain bread. Fill about one quarter of your plate with whole grains. ? Eat or drink low-fat dairy products, such as skim milk or low-fat yogurt. ? Avoid fatty cuts of meat, processed or cured meats, and poultry with skin. Fill about one quarter of your plate with lean proteins, such as fish, chicken without skin, beans, eggs, and tofu. ? Avoid premade and processed foods. These tend to be higher in sodium, added sugar, and fat.  Reduce your daily sodium intake. Most people with hypertension should eat less than 1,500 mg of sodium a day.  Limit alcohol intake to no more than 1 drink a  day for nonpregnant women and 2 drinks a day for men. One drink equals 12 oz of beer, 5 oz of wine, or 1 oz of hard liquor. Lifestyle  Work with your health care provider to maintain a healthy body weight or to lose weight. Ask what an ideal weight is for you.  Get at least 30 minutes of exercise that causes your heart to beat faster (aerobic exercise) most days of the week. Activities may include walking, swimming, or biking.  Include exercise to strengthen your muscles (resistance exercise), such as pilates or lifting weights, as part of your weekly exercise routine. Try to do these types of exercises for 30 minutes at least 3 days a week.  Do not use any products that contain nicotine or tobacco, such as cigarettes and e-cigarettes. If you need help quitting, ask your health care provider.  Monitor your blood pressure at home as  told by your health care provider.  Keep all follow-up visits as told by your health care provider. This is important. Medicines  Take over-the-counter and prescription medicines only as told by your health care provider. Follow directions carefully. Blood pressure medicines must be taken as prescribed.  Do not skip doses of blood pressure medicine. Doing this puts you at risk for problems and can make the medicine less effective.  Ask your health care provider about side effects or reactions to medicines that you should watch for. Contact a health care provider if:  You think you are having a reaction to a medicine you are taking.  You have headaches that keep coming back (recurring).  You feel dizzy.  You have swelling in your ankles.  You have trouble with your vision. Get help right away if:  You develop a severe headache or confusion.  You have unusual weakness or numbness.  You feel faint.  You have severe pain in your chest or abdomen.  You vomit repeatedly.  You have trouble breathing. Summary  Hypertension is when the force of blood pumping through your arteries is too strong. If this condition is not controlled, it may put you at risk for serious complications.  Your personal target blood pressure may vary depending on your medical conditions, your age, and other factors. For most people, a normal blood pressure is less than 120/80.  Hypertension is treated with lifestyle changes, medicines, or a combination of both. Lifestyle changes include weight loss, eating a healthy, low-sodium diet, exercising more, and limiting alcohol. This information is not intended to replace advice given to you by your health care provider. Make sure you discuss any questions you have with your health care provider. Document Released: 03/14/2005 Document Revised: 02/10/2016 Document Reviewed: 02/10/2016 Elsevier Interactive Patient Education  2018 [employer].    IF you received  an x-ray today, you will receive an invoice from [facility]. Please contact [facility] at [phone] with questions or concerns regarding your invoice.   IF you received labwork today, you will receive an invoice from [facility]. Please contact [facility] at [phone] with questions or concerns regarding your invoice.   Our billing staff will not be able to assist you with questions regarding bills from these companies.  You will be contacted with the lab results as soon as they are available. The fastest way to get your results is to activate your My Chart account. Instructions are located on the last page of this paperwork. If you have not heard from [place] regarding the results in 2 weeks, please contact this  office.

## 2018-06-04 ENCOUNTER — Encounter (HOSPITAL_COMMUNITY): Payer: Self-pay

## 2018-06-04 ENCOUNTER — Other Ambulatory Visit: Payer: Self-pay

## 2018-06-04 ENCOUNTER — Emergency Department (HOSPITAL_COMMUNITY)
Admission: EM | Admit: 2018-06-04 | Discharge: 2018-06-04 | Disposition: A | Discharge status: Home / Self Care | Payer: BLUE CROSS/BLUE SHIELD | Attending: Emergency Medicine | Admitting: Emergency Medicine

## 2018-06-04 DIAGNOSIS — Z79899 Other long term (current) drug therapy: Secondary | ICD-10-CM | POA: Diagnosis not present

## 2018-06-04 DIAGNOSIS — J029 Acute pharyngitis, unspecified: Secondary | ICD-10-CM | POA: Diagnosis not present

## 2018-06-04 LAB — GROUP A STREP BY PCR: GROUP A STREP BY PCR: NOT DETECTED

## 2018-06-04 NOTE — Discharge Instructions (Signed)
It was my pleasure taking care of you today!   Flonase or Nasacort are over-the-counter nasal sprays which help with nasal congestion and sinus pressure if needed.   Benadryl, Claritin or Zyrtec can help as well.  Follow-up with your primary care doctor if you are not feeling better in the next week.  Return to the emergency department for new or worsening symptoms, any additional concerns.

## 2018-06-04 NOTE — ED Provider Notes (Signed)
[facility] DEPT Provider Note   CSN: [id number] Arrival date & time: 06/04/18  1331    History   Chief Complaint Chief Complaint  Patient presents with  . Sore Throat    HPI Toni Valencia is a 43 y.o. female.     The history is provided by the patient and medical records. No language interpreter was used.  Sore Throat  Pertinent negatives include no chest pain, no abdominal pain and no shortness of breath.   Toni Valencia is a 43 y.o. female  who presents to the Emergency Department for evaluation of sore throat.  Patient states that she woke up this morning with a scratchy throat.  She works at a daycare center.  When she got to work, they noticed that her voice was a little hoarse and asked her if she felt okay.  She endorsed having a sore throat and they wanted to ensure that she did not have strep, therefore told her to go home / to the doctor.  She denies any fevers, cough or congestion.  She did take a Benadryl as she assumed her sore throat was due to her allergies.  Past Medical History:  Diagnosis Date  . Anemia   . Strep throat     There are no active problems to display for this patient.   Past Surgical History:  Procedure Laterality Date  . ABDOMINAL HYSTERECTOMY    . APPENDECTOMY       OB History    Gravida  1   Para  1   Term  1   Preterm      AB      Living  1     SAB      TAB      Ectopic      Multiple      Live Births               Home Medications    Prior to Admission medications   Medication Sig Start Date End Date Taking? Authorizing Provider  amLODipine (NORVASC) 5 MG tablet Take 1 tablet (5 mg total) by mouth 2 (two) times daily. 08/24/17   Jaynee Eagles, PA-C  Blood Pressure Monitoring (SPHYGMOMANOMETER) MISC 1 Units by Does not apply route once a week. 07/25/17   Jaynee Eagles, PA-C  hydrOXYzine (ATARAX/VISTARIL) 25 MG tablet Take 1-2 tablets (25-50 mg total) by mouth at bedtime as  needed for itching. 07/24/17   Jaynee Eagles, PA-C  ibuprofen (ADVIL,MOTRIN) 200 MG tablet Take 400 mg by mouth every 6 (six) hours as needed for headache, mild pain or moderate pain.    [provider]    Family History Family History  Problem Relation Age of Onset  . Diabetes Mother     Social History Social History   Tobacco Use  . Smoking status: Never Smoker  . Smokeless tobacco: Never Used  Substance Use Topics  . Alcohol use: Yes  . Drug use: No     Allergies   Lisinopril   Review of Systems Review of Systems  Constitutional: Negative for chills and fever.  HENT: Positive for sore throat. Negative for congestion.   Respiratory: Negative for cough and shortness of breath.   Cardiovascular: Negative for chest pain.  Gastrointestinal: Negative for abdominal pain.     Physical Exam Updated Vital Signs BP (!) 141/97 (BP Location: Right Arm)   Pulse (!) 104   Temp 98.3 F (36.8 C) (Oral)   Resp 18  Ht 5\' 9"  (1.753 m)   Wt 88.9 kg   LMP 05/04/2015 Comment: light bleeding for a month, became heavy today  SpO2 95%   BMI 28.94 kg/m   Physical Exam Vitals signs and nursing note reviewed.  Constitutional:      General: She is not in acute distress.    Appearance: She is well-developed.  HENT:     Head: Normocephalic and atraumatic.     Mouth/Throat:     Comments: +PND; no tonsillar hypertrophy or exudates. Neck:     Musculoskeletal: Neck supple.  Cardiovascular:     Rate and Rhythm: Normal rate and regular rhythm.     Heart sounds: Normal heart sounds. No murmur.  Pulmonary:     Effort: Pulmonary effort is normal. No respiratory distress.     Breath sounds: Normal breath sounds. No wheezing or rales.     Comments: Lungs clear to auscultation bilaterally. Musculoskeletal: Normal range of motion.  Skin:    General: Skin is warm and dry.  Neurological:     Mental Status: She is alert.      ED Treatments / Results  Labs (all labs ordered  are listed, but only abnormal results are displayed) Labs Reviewed  GROUP A STREP BY PCR    EKG None  Radiology No results found.  Procedures Procedures (including critical care time)  Medications Ordered in ED Medications - No data to display   Initial Impression / Assessment and Plan / ED Course  I have reviewed the triage vital signs and the nursing notes.  Pertinent labs & imaging results that were available during my care of the patient were reviewed by me and considered in my medical decision making (see chart for details).       Toni Valencia is a 43 y.o. female who presents to ED for sore throat.   On exam, patient is afebrile, non-toxic appearing with a clear lung exam.  She has postnasal drip, but no exudates or tonsillar hypertrophy.  Rapid strep negative.  Sxs today likely due to viral pharyngitis versus allergies. Symptomatic home care instructions discussed. PCP follow up strongly encouraged if symptoms persist. Reasons to return to ER discussed. All questions answered.    Final Clinical Impressions(s) / ED Diagnoses   Final diagnoses:  Sore throat    ED Discharge Orders    None       , Ozella Almond, PA-C 06/04/18 1521    Valarie Merino, MD 06/06/18 [phone]

## 2018-06-04 NOTE — ED Triage Notes (Signed)
Patient c/o sore throat since yesterday. Patient states she works at a [employer] and want to make sure that the patient did not have strep.

## 2018-11-05 ENCOUNTER — Telehealth: Payer: Self-pay | Admitting: Registered Nurse

## 2018-11-05 ENCOUNTER — Other Ambulatory Visit: Payer: Self-pay

## 2018-11-05 ENCOUNTER — Encounter: Payer: Self-pay | Admitting: Registered Nurse

## 2018-11-05 ENCOUNTER — Ambulatory Visit: Payer: BLUE CROSS/BLUE SHIELD | Admitting: Registered Nurse

## 2018-11-05 VITALS — BP 134/92 | HR 80 | Temp 98.6°F | Resp 16 | Ht 67.52 in | Wt 186.0 lb

## 2018-11-05 DIAGNOSIS — Z130 Encounter for screening for diseases of the blood and blood-forming organs and certain disorders involving the immune mechanism: Secondary | ICD-10-CM | POA: Diagnosis not present

## 2018-11-05 DIAGNOSIS — Z13220 Encounter for screening for lipoid disorders: Secondary | ICD-10-CM | POA: Diagnosis not present

## 2018-11-05 DIAGNOSIS — Z7689 Persons encountering health services in other specified circumstances: Secondary | ICD-10-CM | POA: Diagnosis not present

## 2018-11-05 DIAGNOSIS — Z1329 Encounter for screening for other suspected endocrine disorder: Secondary | ICD-10-CM

## 2018-11-05 DIAGNOSIS — I10 Essential (primary) hypertension: Secondary | ICD-10-CM | POA: Diagnosis not present

## 2018-11-05 DIAGNOSIS — Z13228 Encounter for screening for other metabolic disorders: Secondary | ICD-10-CM | POA: Diagnosis not present

## 2018-11-05 MED ORDER — AMLODIPINE BESYLATE 5 MG PO TABS: 5.0000 mg | ORAL_TABLET | Freq: Two times a day (BID) | ORAL | 1 refills | Status: DC

## 2018-11-05 NOTE — Progress Notes (Signed)
Established Patient Office Visit  Subjective:  Patient ID: Toni Valencia, female    DOB: Feb 10, 1976  Age: 43 y.o. MRN: [id number]  CC:  Chief Complaint  Patient presents with  . Establish Care    need new pcp to manage medications for HTN  . Hypertension    HPI Toni Valencia presents for TOC and med refills  No complaints at this time Has been taking 1-2 amlodipine 5mg  tabs daily - BP mildly elevated today. She has not yet taken her meds and has been taking 1 tab daily. Discussed taking 2 tabs daily and monitoring BP.  Otherwise, no updates on review of medical history, denies all symptoms of HTN   Past Medical History:  Diagnosis Date  . Anemia   . Hypertension   . Strep throat     Past Surgical History:  Procedure Laterality Date  . ABDOMINAL HYSTERECTOMY    . APPENDECTOMY      Family History  Problem Relation Age of Onset  . Diabetes Mother     Social History   Socioeconomic History  . Marital status: Single    Spouse name: Not on file  . Number of children: 1  . Years of education: Not on file  . Highest education level: Not on file  Occupational History  . Not on file  Social Needs  . Financial resource strain: Not hard at all  . Food insecurity    Worry: Never true    Inability: Never true  . Transportation needs    Medical: No    Non-medical: No  Tobacco Use  . Smoking status: Former Smoker    Years: 1.00  . Smokeless tobacco: Never Used  Substance and Sexual Activity  . Alcohol use: Yes  . Drug use: No  . Sexual activity: Yes    Birth control/protection: None  Lifestyle  . Physical activity    Days per week: 3 days    Minutes per session: 30 min  . Stress: Not at all  Relationships  . Social Herbalist on phone: Three times a week    Gets together: Twice a week    Attends religious service: Patient refused    Active member of club or organization: Patient refused    Attends meetings of clubs or organizations:  Patient refused    Relationship status: Patient refused  . Intimate partner violence    Fear of current or ex partner: No    Emotionally abused: No    Physically abused: No    Forced sexual activity: No  Other Topics Concern  . Not on file  Social History Narrative  . Not on file    Outpatient Medications Prior to Visit  Medication Sig Dispense Refill  . amLODipine (NORVASC) 5 MG tablet Take 1 tablet (5 mg total) by mouth 2 (two) times daily. 180 tablet 3  . Blood Pressure Monitoring (SPHYGMOMANOMETER) MISC 1 Units by Does not apply route once a week. 1 each 0  . ibuprofen (ADVIL,MOTRIN) 200 MG tablet Take 400 mg by mouth every 6 (six) hours as needed for headache, mild pain or moderate pain.    . hydrOXYzine (ATARAX/VISTARIL) 25 MG tablet Take 1-2 tablets (25-50 mg total) by mouth at bedtime as needed for itching. 30 tablet 0   No facility-administered medications prior to visit.     Allergies  Allergen Reactions  . Lisinopril Swelling    Caused pt lip to swell     ROS Review  of Systems  Constitutional: Negative.   HENT: Negative.   Eyes: Negative.   Respiratory: Negative.   Cardiovascular: Negative.   Gastrointestinal: Negative.   Endocrine: Negative.   Genitourinary: Negative.   Musculoskeletal: Negative.   Skin: Negative.   Allergic/Immunologic: Negative.   Neurological: Negative.   Hematological: Negative.   Psychiatric/Behavioral: Negative.   All other systems reviewed and are negative.     Objective:    Physical Exam  Constitutional: She is oriented to person, place, and time. She appears well-developed and well-nourished. No distress.  Cardiovascular: Normal rate and regular rhythm.  Pulmonary/Chest: Effort normal. No respiratory distress.  Neurological: She is alert and oriented to person, place, and time.  Skin: Skin is warm and dry. No rash noted. She is not diaphoretic. No erythema. No pallor.  Psychiatric: She has a normal mood and affect. Her  behavior is normal. Judgment and thought content normal.  Nursing note and vitals reviewed.   BP (!) 134/92   Pulse 80   Temp 98.6 F (37 C) (Oral)   Resp 16   Ht 5' 7.52" (1.715 m)   Wt 186 lb (84.4 kg)   LMP 05/04/2015 Comment: light bleeding for a month, became heavy today  SpO2 99%   BMI 28.68 kg/m  Wt Readings from Last 3 Encounters:  11/05/18 186 lb (84.4 kg)  06/04/18 196 lb (88.9 kg)  08/24/17 193 lb 9.6 oz (87.8 kg)     Health Maintenance Due  Topic Date Due  . TETANUS/TDAP  09/10/1992  . PAP SMEAR-Modifier  02/26/2018  . INFLUENZA VACCINE  10/27/2018    There are no preventive care reminders to display for this patient.  No results found for: TSH Lab Results  Component Value Date   WBC 8.3 05/21/2015   HGB 11.7 (L) 05/21/2015   HCT 36.9 05/21/2015   MCV 86.8 05/21/2015   PLT 387 05/21/2015   Lab Results  Component Value Date   NA 142 07/25/2017   K 3.9 07/25/2017   CO2 20 07/25/2017   GLUCOSE 95 07/25/2017   BUN 12 07/25/2017   CREATININE 0.96 07/25/2017   BILITOT 0.5 07/25/2017   ALKPHOS 85 07/25/2017   AST 17 07/25/2017   ALT 17 07/25/2017   PROT 7.7 07/25/2017   ALBUMIN 4.6 07/25/2017   CALCIUM 9.7 07/25/2017   ANIONGAP 10 02/20/2015   Lab Results  Component Value Date   CHOL 215 (H) 07/25/2017   Lab Results  Component Value Date   HDL 69 07/25/2017   Lab Results  Component Value Date   LDLCALC 126 (H) 07/25/2017   Lab Results  Component Value Date   TRIG 98 07/25/2017   Lab Results  Component Value Date   CHOLHDL 3.1 07/25/2017   Lab Results  Component Value Date   HGBA1C 5.3 07/25/2017      Assessment & Plan:   Problem List Items Addressed This Visit    None    Visit Diagnoses    Screening for endocrine, metabolic and immunity disorder    -  Primary   Relevant Orders   CBC   Comprehensive metabolic panel   Hemoglobin A1c   TSH   Lipid screening       Relevant Orders   Lipid panel      No orders of the  defined types were placed in this encounter.   Follow-up: Return in 6 months (on 05/08/2019).    Maximiano Coss, NP

## 2018-11-05 NOTE — Patient Instructions (Addendum)
If you have lab work done today you will be contacted with your lab results within the next 2 weeks.  If you have not heard from [place] then please contact us. The fastest way to get your results is to register for My Chart.   IF you received an x-ray today, you will receive an invoice from [facility]. Please contact [facility] at [phone] with questions or concerns regarding your invoice.   IF you received labwork today, you will receive an invoice from [facility]. Please contact [facility] at [phone] with questions or concerns regarding your invoice.   Our billing staff will not be able to assist you with questions regarding bills from these companies.  You will be contacted with the lab results as soon as they are available. The fastest way to get your results is to activate your My Chart account. Instructions are located on the last page of this paperwork. If you have not heard from [place] regarding the results in 2 weeks, please contact this office.       Hypertension, Adult High blood pressure (hypertension) is when the force of blood pumping through the arteries is too strong. The arteries are the blood vessels that carry blood from the heart throughout the body. Hypertension forces the heart to work harder to pump blood and may cause arteries to become narrow or stiff. Untreated or uncontrolled hypertension can cause a heart attack, heart failure, a stroke, kidney disease, and other problems. A blood pressure reading consists of a higher number over a lower number. Ideally, your blood pressure should be below 120/80. The first ("top") number is called the systolic pressure. It is a measure of the pressure in your arteries as your heart beats. The second ("bottom") number is called the diastolic pressure. It is a measure of the pressure in your arteries as the heart relaxes. What are the causes? The exact cause of this condition is not known. There are some  conditions that result in or are related to high blood pressure. What increases the risk? Some risk factors for high blood pressure are under your control. The following factors may make you more likely to develop this condition:  Smoking.  Having type 2 diabetes mellitus, high cholesterol, or both.  Not getting enough exercise or physical activity.  Being overweight.  Having too much fat, sugar, calories, or salt (sodium) in your diet.  Drinking too much alcohol. Some risk factors for high blood pressure may be difficult or impossible to change. Some of these factors include:  Having chronic kidney disease.  Having a family history of high blood pressure.  Age. Risk increases with age.  Race. You may be at higher risk if you are African American.  Gender. Men are at higher risk than women before age 55. After age 40, women are at higher risk than men.  Having obstructive sleep apnea.  Stress. What are the signs or symptoms? High blood pressure may not cause symptoms. Very high blood pressure (hypertensive crisis) may cause:  Headache.  Anxiety.  Shortness of breath.  Nosebleed.  Nausea and vomiting.  Vision changes.  Severe chest pain.  Seizures. How is this diagnosed? This condition is diagnosed by measuring your blood pressure while you are seated, with your arm resting on a flat surface, your legs uncrossed, and your feet flat on the floor. The cuff of the blood pressure monitor will be placed directly against the skin of your upper arm at the level of  your heart. It should be measured at least twice using the same arm. Certain conditions can cause a difference in blood pressure between your right and left arms. Certain factors can cause blood pressure readings to be lower or higher than normal for a short period of time:  When your blood pressure is higher when you are in a health care provider's office than when you are at home, this is called white coat  hypertension. Most people with this condition do not need medicines.  When your blood pressure is higher at home than when you are in a health care provider's office, this is called masked hypertension. Most people with this condition may need medicines to control blood pressure. If you have a high blood pressure reading during one visit or you have normal blood pressure with other risk factors, you may be asked to:  Return on a different day to have your blood pressure checked again.  Monitor your blood pressure at home for 1 week or longer. If you are diagnosed with hypertension, you may have other blood or imaging tests to help your health care provider understand your overall risk for other conditions. How is this treated? This condition is treated by making healthy lifestyle changes, such as eating healthy foods, exercising more, and reducing your alcohol intake. Your health care provider may prescribe medicine if lifestyle changes are not enough to get your blood pressure under control, and if:  Your systolic blood pressure is above 130.  Your diastolic blood pressure is above 80. Your personal target blood pressure may vary depending on your medical conditions, your age, and other factors. Follow these instructions at home: Eating and drinking   Eat a diet that is high in fiber and potassium, and low in sodium, added sugar, and fat. An example eating plan is called the DASH (Dietary Approaches to Stop Hypertension) diet. To eat this way: ? Eat plenty of fresh fruits and vegetables. Try to fill one half of your plate at each meal with fruits and vegetables. ? Eat whole grains, such as whole-wheat pasta, brown rice, or whole-grain bread. Fill about one fourth of your plate with whole grains. ? Eat or drink low-fat dairy products, such as skim milk or low-fat yogurt. ? Avoid fatty cuts of meat, processed or cured meats, and poultry with skin. Fill about one fourth of your plate with lean  proteins, such as fish, chicken without skin, beans, eggs, or tofu. ? Avoid pre-made and processed foods. These tend to be higher in sodium, added sugar, and fat.  Reduce your daily sodium intake. Most people with hypertension should eat less than 1,500 mg of sodium a day.  Do not drink alcohol if: ? Your health care provider tells you not to drink. ? You are pregnant, may be pregnant, or are planning to become pregnant.  If you drink alcohol: ? Limit how much you use to:  0-1 drink a day for women.  0-2 drinks a day for men. ? Be aware of how much alcohol is in your drink. In the [place] one drink equals one 12 oz bottle of beer (355 mL), one 5 oz glass of wine (148 mL), or one 1 oz glass of hard liquor (44 mL). Lifestyle   Work with your health care provider to maintain a healthy body weight or to lose weight. Ask what an ideal weight is for you.  Get at least 30 minutes of exercise most days of the week. Activities may include walking,  swimming, or biking.  Include exercise to strengthen your muscles (resistance exercise), such as Pilates or lifting weights, as part of your weekly exercise routine. Try to do these types of exercises for 30 minutes at least 3 days a week.  Do not use any products that contain nicotine or tobacco, such as cigarettes, e-cigarettes, and chewing tobacco. If you need help quitting, ask your health care provider.  Monitor your blood pressure at home as told by your health care provider.  Keep all follow-up visits as told by your health care provider. This is important. Medicines  Take over-the-counter and prescription medicines only as told by your health care provider. Follow directions carefully. Blood pressure medicines must be taken as prescribed.  Do not skip doses of blood pressure medicine. Doing this puts you at risk for problems and can make the medicine less effective.  Ask your health care provider about side effects or reactions to  medicines that you should watch for. Contact a health care provider if you:  Think you are having a reaction to a medicine you are taking.  Have headaches that keep coming back (recurring).  Feel dizzy.  Have swelling in your ankles.  Have trouble with your vision. Get help right away if you:  Develop a severe headache or confusion.  Have unusual weakness or numbness.  Feel faint.  Have severe pain in your chest or abdomen.  Vomit repeatedly.  Have trouble breathing. Summary  Hypertension is when the force of blood pumping through your arteries is too strong. If this condition is not controlled, it may put you at risk for serious complications.  Your personal target blood pressure may vary depending on your medical conditions, your age, and other factors. For most people, a normal blood pressure is less than 120/80.  Hypertension is treated with lifestyle changes, medicines, or a combination of both. Lifestyle changes include losing weight, eating a healthy, low-sodium diet, exercising more, and limiting alcohol. This information is not intended to replace advice given to you by your health care provider. Make sure you discuss any questions you have with your health care provider. Document Released: 03/14/2005 Document Revised: 11/22/2017 Document Reviewed: 11/22/2017 Elsevier Patient Education  2020 [place] Your Hypertension Hypertension is commonly called high blood pressure. This is when the force of your blood pressing against the walls of your arteries is too strong. Arteries are blood vessels that carry blood from your heart throughout your body. Hypertension forces the heart to work harder to pump blood, and may cause the arteries to become narrow or stiff. Having untreated or uncontrolled hypertension can cause heart attack, stroke, kidney disease, and other problems. What are blood pressure readings? A blood pressure reading consists of a higher  number over a lower number. Ideally, your blood pressure should be below 120/80. The first ("top") number is called the systolic pressure. It is a measure of the pressure in your arteries as your heart beats. The second ("bottom") number is called the diastolic pressure. It is a measure of the pressure in your arteries as the heart relaxes. What does my blood pressure reading mean? Blood pressure is classified into four stages. Based on your blood pressure reading, your health care provider may use the following stages to determine what type of treatment you need, if any. Systolic pressure and diastolic pressure are measured in a unit called mm Hg. Normal  Systolic pressure: below 175.  Diastolic pressure: below 80. Elevated  Systolic pressure: 102-585.  Diastolic pressure: below 80. Hypertension stage 1  Systolic pressure: 932-671.  Diastolic pressure: 24-58. Hypertension stage 2  Systolic pressure: 099 or above.  Diastolic pressure: 90 or above. What health risks are associated with hypertension? Managing your hypertension is an important responsibility. Uncontrolled hypertension can lead to:  A heart attack.  A stroke.  A weakened blood vessel (aneurysm).  Heart failure.  Kidney damage.  Eye damage.  Metabolic syndrome.  Memory and concentration problems. What changes can I make to manage my hypertension? Hypertension can be managed by making lifestyle changes and possibly by taking medicines. Your health care provider will help you make a plan to bring your blood pressure within a normal range. Eating and drinking   Eat a diet that is high in fiber and potassium, and low in salt (sodium), added sugar, and fat. An example eating plan is called the DASH (Dietary Approaches to Stop Hypertension) diet. To eat this way: ? Eat plenty of fresh fruits and vegetables. Try to fill half of your plate at each meal with fruits and vegetables. ? Eat whole grains, such as whole  wheat pasta, brown rice, or whole grain bread. Fill about one quarter of your plate with whole grains. ? Eat low-fat diary products. ? Avoid fatty cuts of meat, processed or cured meats, and poultry with skin. Fill about one quarter of your plate with lean proteins such as fish, chicken without skin, beans, eggs, and tofu. ? Avoid premade and processed foods. These tend to be higher in sodium, added sugar, and fat.  Reduce your daily sodium intake. Most people with hypertension should eat less than 1,500 mg of sodium a day.  Limit alcohol intake to no more than 1 drink a day for nonpregnant women and 2 drinks a day for men. One drink equals 12 oz of beer, 5 oz of wine, or 1 oz of hard liquor. Lifestyle  Work with your health care provider to maintain a healthy body weight, or to lose weight. Ask what an ideal weight is for you.  Get at least 30 minutes of exercise that causes your heart to beat faster (aerobic exercise) most days of the week. Activities may include walking, swimming, or biking.  Include exercise to strengthen your muscles (resistance exercise), such as weight lifting, as part of your weekly exercise routine. Try to do these types of exercises for 30 minutes at least 3 days a week.  Do not use any products that contain nicotine or tobacco, such as cigarettes and e-cigarettes. If you need help quitting, ask your health care provider.  Control any long-term (chronic) conditions you have, such as high cholesterol or diabetes. Monitoring  Monitor your blood pressure at home as told by your health care provider. Your personal target blood pressure may vary depending on your medical conditions, your age, and other factors.  Have your blood pressure checked regularly, as often as told by your health care provider. Working with your health care provider  Review all the medicines you take with your health care provider because there may be side effects or interactions.  Talk with  your health care provider about your diet, exercise habits, and other lifestyle factors that may be contributing to hypertension.  Visit your health care provider regularly. Your health care provider can help you create and adjust your plan for managing hypertension. Will I need medicine to control my blood pressure? Your health care provider may prescribe medicine if lifestyle changes are not enough to get your  blood pressure under control, and if:  Your systolic blood pressure is 130 or higher.  Your diastolic blood pressure is 80 or higher. Take medicines only as told by your health care provider. Follow the directions carefully. Blood pressure medicines must be taken as prescribed. The medicine does not work as well when you skip doses. Skipping doses also puts you at risk for problems. Contact a health care provider if:  You think you are having a reaction to medicines you have taken.  You have repeated (recurrent) headaches.  You feel dizzy.  You have swelling in your ankles.  You have trouble with your vision. Get help right away if:  You develop a severe headache or confusion.  You have unusual weakness or numbness, or you feel faint.  You have severe pain in your chest or abdomen.  You vomit repeatedly.  You have trouble breathing. Summary  Hypertension is when the force of blood pumping through your arteries is too strong. If this condition is not controlled, it may put you at risk for serious complications.  Your personal target blood pressure may vary depending on your medical conditions, your age, and other factors. For most people, a normal blood pressure is less than 120/80.  Hypertension is managed by lifestyle changes, medicines, or both. Lifestyle changes include weight loss, eating a healthy, low-sodium diet, exercising more, and limiting alcohol. This information is not intended to replace advice given to you by your health care provider. Make sure you  discuss any questions you have with your health care provider. Document Released: 12/07/2011 Document Revised: 07/06/2018 Document Reviewed: 02/10/2016 Elsevier Patient Education  2020 [place] DASH stands for "Dietary Approaches to Stop Hypertension." The DASH eating plan is a healthy eating plan that has been shown to reduce high blood pressure (hypertension). It may also reduce your risk for type 2 diabetes, heart disease, and stroke. The DASH eating plan may also help with weight loss. What are tips for following this plan?  General guidelines  Avoid eating more than 2,300 mg (milligrams) of salt (sodium) a day. If you have hypertension, you may need to reduce your sodium intake to 1,500 mg a day.  Limit alcohol intake to no more than 1 drink a day for nonpregnant women and 2 drinks a day for men. One drink equals 12 oz of beer, 5 oz of wine, or 1 oz of hard liquor.  Work with your health care provider to maintain a healthy body weight or to lose weight. Ask what an ideal weight is for you.  Get at least 30 minutes of exercise that causes your heart to beat faster (aerobic exercise) most days of the week. Activities may include walking, swimming, or biking.  Work with your health care provider or diet and nutrition specialist (dietitian) to adjust your eating plan to your individual calorie needs. Reading food labels   Check food labels for the amount of sodium per serving. Choose foods with less than 5 percent of the Daily Value of sodium. Generally, foods with less than 300 mg of sodium per serving fit into this eating plan.  To find whole grains, look for the word "whole" as the first word in the ingredient list. Shopping  Buy products labeled as "low-sodium" or "no salt added."  Buy fresh foods. Avoid canned foods and premade or frozen meals. Cooking  Avoid adding salt when cooking. Use salt-free seasonings or herbs instead of table salt or sea salt.  Check with your health care provider or pharmacist before using salt substitutes.  Do not fry foods. Cook foods using healthy methods such as baking, boiling, grilling, and broiling instead.  Cook with heart-healthy oils, such as olive, canola, soybean, or sunflower oil. Meal planning  Eat a balanced diet that includes: ? 5 or more servings of fruits and vegetables each day. At each meal, try to fill half of your plate with fruits and vegetables. ? Up to 6-8 servings of whole grains each day. ? Less than 6 oz of lean meat, poultry, or fish each day. A 3-oz serving of meat is about the same size as a deck of cards. One egg equals 1 oz. ? 2 servings of low-fat dairy each day. ? A serving of nuts, seeds, or beans 5 times each week. ? Heart-healthy fats. Healthy fats called Omega-3 fatty acids are found in foods such as flaxseeds and coldwater fish, like sardines, salmon, and mackerel.  Limit how much you eat of the following: ? Canned or prepackaged foods. ? Food that is high in trans fat, such as fried foods. ? Food that is high in saturated fat, such as fatty meat. ? Sweets, desserts, sugary drinks, and other foods with added sugar. ? Full-fat dairy products.  Do not salt foods before eating.  Try to eat at least 2 vegetarian meals each week.  Eat more home-cooked food and less restaurant, buffet, and fast food.  When eating at a restaurant, ask that your food be prepared with less salt or no salt, if possible. What foods are recommended? The items listed may not be a complete list. Talk with your dietitian about what dietary choices are best for you. Grains Whole-grain or whole-wheat bread. Whole-grain or whole-wheat pasta. Brown rice. [place]. Bulgur. Whole-grain and low-sodium cereals. Pita bread. Low-fat, low-sodium crackers. Whole-wheat flour tortillas. Vegetables Fresh or frozen vegetables (raw, steamed, roasted, or grilled). Low-sodium or reduced-sodium tomato and  vegetable juice. Low-sodium or reduced-sodium tomato sauce and tomato paste. Low-sodium or reduced-sodium canned vegetables. Fruits All fresh, dried, or frozen fruit. Canned fruit in natural juice (without added sugar). Meat and other protein foods Skinless chicken or [place]. Ground chicken or [place]. Pork with fat trimmed off. Fish and seafood. Egg whites. Dried beans, peas, or lentils. Unsalted nuts, nut butters, and seeds. Unsalted canned beans. Lean cuts of beef with fat trimmed off. Low-sodium, lean deli meat. Dairy Low-fat (1%) or fat-free (skim) milk. Fat-free, low-fat, or reduced-fat cheeses. Nonfat, low-sodium ricotta or cottage cheese. Low-fat or nonfat yogurt. Low-fat, low-sodium cheese. Fats and oils Soft margarine without trans fats. Vegetable oil. Low-fat, reduced-fat, or light mayonnaise and salad dressings (reduced-sodium). Canola, safflower, olive, soybean, and sunflower oils. Avocado. Seasoning and other foods Herbs. Spices. Seasoning mixes without salt. Unsalted popcorn and pretzels. Fat-free sweets. What foods are not recommended? The items listed may not be a complete list. Talk with your dietitian about what dietary choices are best for you. Grains Baked goods made with fat, such as croissants, muffins, or some breads. Dry pasta or rice meal packs. Vegetables Creamed or fried vegetables. Vegetables in a cheese sauce. Regular canned vegetables (not low-sodium or reduced-sodium). Regular canned tomato sauce and paste (not low-sodium or reduced-sodium). Regular tomato and vegetable juice (not low-sodium or reduced-sodium). Angie Fava. Olives. Fruits Canned fruit in a light or heavy syrup. Fried fruit. Fruit in cream or butter sauce. Meat and other protein foods Fatty cuts of meat. Ribs. Fried meat. Berniece Salines. Sausage. Bologna and other processed lunch  meats. Salami. Fatback. Hotdogs. Bratwurst. Salted nuts and seeds. Canned beans with added salt. Canned or smoked fish. Whole eggs or  egg yolks. Chicken or [place] with skin. Dairy Whole or 2% milk, cream, and half-and-half. Whole or full-fat cream cheese. Whole-fat or sweetened yogurt. Full-fat cheese. Nondairy creamers. Whipped toppings. Processed cheese and cheese spreads. Fats and oils Butter. Stick margarine. Lard. Shortening. Ghee. Bacon fat. Tropical oils, such as coconut, palm kernel, or palm oil. Seasoning and other foods Salted popcorn and pretzels. Onion salt, garlic salt, seasoned salt, table salt, and sea salt. Worcestershire sauce. Tartar sauce. Barbecue sauce. Teriyaki sauce. Soy sauce, including reduced-sodium. Steak sauce. Canned and packaged gravies. Fish sauce. Oyster sauce. Cocktail sauce. Horseradish that you find on the shelf. Ketchup. Mustard. Meat flavorings and tenderizers. Bouillon cubes. Hot sauce and Tabasco sauce. Premade or packaged marinades. Premade or packaged taco seasonings. Relishes. Regular salad dressings. Where to find more information:  National Heart, [employer]: https://wilson-eaton.com/  American Heart Association: www.heart.org Summary  The DASH eating plan is a healthy eating plan that has been shown to reduce high blood pressure (hypertension). It may also reduce your risk for type 2 diabetes, heart disease, and stroke.  With the DASH eating plan, you should limit salt (sodium) intake to 2,300 mg a day. If you have hypertension, you may need to reduce your sodium intake to 1,500 mg a day.  When on the DASH eating plan, aim to eat more fresh fruits and vegetables, whole grains, lean proteins, low-fat dairy, and heart-healthy fats.  Work with your health care provider or diet and nutrition specialist (dietitian) to adjust your eating plan to your individual calorie needs. This information is not intended to replace advice given to you by your health care provider. Make sure you discuss any questions you have with your health care provider. Document Released: 03/03/2011  Document Revised: 02/24/2017 Document Reviewed: 03/07/2016 Elsevier Patient Education  [place].   Low-Sodium Eating Plan Sodium, which is an element that makes up salt, helps you maintain a healthy balance of fluids in your body. Too much sodium can increase your blood pressure and cause fluid and waste to be held in your body. Your health care provider or dietitian may recommend following this plan if you have high blood pressure (hypertension), kidney disease, liver disease, or heart failure. Eating less sodium can help lower your blood pressure, reduce swelling, and protect your heart, liver, and kidneys. What are tips for following this plan? General guidelines  Most people on this plan should limit their sodium intake to 1,500-2,000 mg (milligrams) of sodium each day. Reading food labels   The Nutrition Facts label lists the amount of sodium in one serving of the food. If you eat more than one serving, you must multiply the listed amount of sodium by the number of servings.  Choose foods with less than 140 mg of sodium per serving.  Avoid foods with 300 mg of sodium or more per serving. Shopping  Look for lower-sodium products, often labeled as "low-sodium" or "no salt added."  Always check the sodium content even if foods are labeled as "unsalted" or "no salt added".  Buy fresh foods. ? Avoid canned foods and premade or frozen meals. ? Avoid canned, cured, or processed meats  Buy breads that have less than 80 mg of sodium per slice. Cooking  Eat more home-cooked food and less restaurant, buffet, and fast food.  Avoid adding salt when cooking. Use salt-free seasonings or  herbs instead of table salt or sea salt. Check with your health care provider or pharmacist before using salt substitutes.  Cook with plant-based oils, such as canola, sunflower, or olive oil. Meal planning  When eating at a restaurant, ask that your food be prepared with less salt or no salt,  if possible.  Avoid foods that contain MSG (monosodium glutamate). MSG is sometimes added to [place] food, bouillon, and some canned foods. What foods are recommended? The items listed may not be a complete list. Talk with your dietitian about what dietary choices are best for you. Grains Low-sodium cereals, including oats, puffed wheat and rice, and shredded wheat. Low-sodium crackers. Unsalted rice. Unsalted pasta. Low-sodium bread. Whole-grain breads and whole-grain pasta. Vegetables Fresh or frozen vegetables. "No salt added" canned vegetables. "No salt added" tomato sauce and paste. Low-sodium or reduced-sodium tomato and vegetable juice. Fruits Fresh, frozen, or canned fruit. Fruit juice. Meats and other protein foods Fresh or frozen (no salt added) meat, poultry, seafood, and fish. Low-sodium canned tuna and salmon. Unsalted nuts. Dried peas, beans, and lentils without added salt. Unsalted canned beans. Eggs. Unsalted nut butters. Dairy Milk. Soy milk. Cheese that is naturally low in sodium, such as ricotta cheese, fresh mozzarella, or Swiss cheese Low-sodium or reduced-sodium cheese. Cream cheese. Yogurt. Fats and oils Unsalted butter. Unsalted margarine with no trans fat. Vegetable oils such as canola or olive oils. Seasonings and other foods Fresh and dried herbs and spices. Salt-free seasonings. Low-sodium mustard and ketchup. Sodium-free salad dressing. Sodium-free light mayonnaise. Fresh or refrigerated horseradish. Lemon juice. Vinegar. Homemade, reduced-sodium, or low-sodium soups. Unsalted popcorn and pretzels. Low-salt or salt-free chips. What foods are not recommended? The items listed may not be a complete list. Talk with your dietitian about what dietary choices are best for you. Grains Instant hot cereals. Bread stuffing, pancake, and biscuit mixes. Croutons. Seasoned rice or pasta mixes. Noodle soup cups. Boxed or frozen macaroni and cheese. Regular salted crackers.  Self-rising flour. Vegetables Sauerkraut, pickled vegetables, and relishes. Olives. [place] fries. Onion rings. Regular canned vegetables (not low-sodium or reduced-sodium). Regular canned tomato sauce and paste (not low-sodium or reduced-sodium). Regular tomato and vegetable juice (not low-sodium or reduced-sodium). Frozen vegetables in sauces. Meats and other protein foods Meat or fish that is salted, canned, smoked, spiced, or pickled. Bacon, ham, sausage, hotdogs, corned beef, chipped beef, packaged lunch meats, salt pork, jerky, pickled herring, anchovies, regular canned tuna, sardines, salted nuts. Dairy Processed cheese and cheese spreads. Cheese curds. Blue cheese. Feta cheese. String cheese. Regular cottage cheese. Buttermilk. Canned milk. Fats and oils Salted butter. Regular margarine. Ghee. Bacon fat. Seasonings and other foods Onion salt, garlic salt, seasoned salt, table salt, and sea salt. Canned and packaged gravies. Worcestershire sauce. Tartar sauce. Barbecue sauce. Teriyaki sauce. Soy sauce, including reduced-sodium. Steak sauce. Fish sauce. Oyster sauce. Cocktail sauce. Horseradish that you find on the shelf. Regular ketchup and mustard. Meat flavorings and tenderizers. Bouillon cubes. Hot sauce and Tabasco sauce. Premade or packaged marinades. Premade or packaged taco seasonings. Relishes. Regular salad dressings. Salsa. Potato and tortilla chips. Corn chips and puffs. Salted popcorn and pretzels. Canned or dried soups. Pizza. Frozen entrees and pot pies. Summary  Eating less sodium can help lower your blood pressure, reduce swelling, and protect your heart, liver, and kidneys.  Most people on this plan should limit their sodium intake to 1,500-2,000 mg (milligrams) of sodium each day.  Canned, boxed, and frozen foods are high in sodium. Restaurant foods, fast foods,  and pizza are also very high in sodium. You also get sodium by adding salt to food.  Try to cook at home, eat  more fresh fruits and vegetables, and eat less fast food, canned, processed, or prepared foods. This information is not intended to replace advice given to you by your health care provider. Make sure you discuss any questions you have with your health care provider. Document Released: 09/03/2001 Document Revised: 02/24/2017 Document Reviewed: 03/07/2016 Elsevier Patient Education  2020 [employer].     Why follow it? Research shows. . Those who follow the Mediterranean diet have a reduced risk of heart disease  . The diet is associated with a reduced incidence of Parkinson's and Alzheimer's diseases . People following the diet may have longer life expectancies and lower rates of chronic diseases  . The Dietary Guidelines for Americans recommends the Mediterranean diet as an eating plan to promote health and prevent disease  What Is the Mediterranean Diet?  . Healthy eating plan based on typical foods and recipes of Mediterranean-style cooking . The diet is primarily a plant based diet; these foods should make up a majority of meals   Starches - Plant based foods should make up a majority of meals - They are an important sources of vitamins, minerals, energy, antioxidants, and fiber - Choose whole grains, foods high in fiber and minimally processed items  - Typical grain sources include wheat, oats, barley, corn, brown rice, bulgar, farro, millet, polenta, couscous  - Various types of beans include chickpeas, lentils, fava beans, black beans, white beans   Fruits  Veggies - Large quantities of antioxidant rich fruits & veggies; 6 or more servings  - Vegetables can be eaten raw or lightly drizzled with oil and cooked  - Vegetables common to the traditional Mediterranean Diet include: artichokes, arugula, beets, broccoli, brussel sprouts, cabbage, carrots, celery, collard greens, cucumbers, eggplant, kale, leeks, lemons, lettuce, mushrooms, okra, onions, peas, peppers, potatoes, pumpkin,  radishes, rutabaga, shallots, spinach, sweet potatoes, turnips, zucchini - Fruits common to the Mediterranean Diet include: apples, apricots, avocados, cherries, clementines, dates, figs, grapefruits, grapes, melons, nectarines, oranges, peaches, pears, pomegranates, strawberries, tangerines  Fats - Replace butter and margarine with healthy oils, such as olive oil, canola oil, and tahini  - Limit nuts to no more than a handful a day  - Nuts include walnuts, almonds, pecans, pistachios, pine nuts  - Limit or avoid candied, honey roasted or heavily salted nuts - Olives are central to the [employer] - can be eaten whole or used in a variety of dishes   Meats Protein - Limiting red meat: no more than a few times a month - When eating red meat: choose lean cuts and keep the portion to the size of deck of cards - Eggs: approx. 0 to 4 times a week  - Fish and lean poultry: at least 2 a week  - Healthy protein sources include, chicken, [place], lean beef, lamb - Increase intake of seafood such as tuna, salmon, trout, mackerel, shrimp, scallops - Avoid or limit high fat processed meats such as sausage and bacon  Dairy - Include moderate amounts of low fat dairy products  - Focus on healthy dairy such as fat free yogurt, skim milk, low or reduced fat cheese - Limit dairy products higher in fat such as whole or 2% milk, cheese, ice cream  Alcohol - Moderate amounts of red wine is ok  - No more than 5 oz daily for women (all ages)  and men older than age 30  - No more than 10 oz of wine daily for men younger than 52  Other - Limit sweets and other desserts  - Use herbs and spices instead of salt to flavor foods  - Herbs and spices common to the traditional Mediterranean Diet include: basil, bay leaves, chives, cloves, cumin, fennel, garlic, lavender, marjoram, mint, oregano, parsley, pepper, rosemary, sage, savory, sumac, tarragon, thyme   It's not just a diet, it's a lifestyle:  . The  Mediterranean diet includes lifestyle factors typical of those in the region  . Foods, drinks and meals are best eaten with others and savored . Daily physical activity is important for overall good health . This could be strenuous exercise like running and aerobics . This could also be more leisurely activities such as walking, housework, yard-work, or taking the stairs . Moderation is the key; a balanced and healthy diet accommodates most foods and drinks . Consider portion sizes and frequency of consumption of certain foods   Meal Ideas & Options:  . Breakfast:  o Whole wheat toast or whole wheat English muffins with peanut butter & hard boiled egg o Steel cut oats topped with apples & cinnamon and skim milk  o Fresh fruit: banana, strawberries, melon, berries, peaches  o Smoothies: strawberries, bananas, greek yogurt, peanut butter o Low fat greek yogurt with blueberries and granola  o Egg white omelet with spinach and mushrooms o Breakfast couscous: whole wheat couscous, apricots, skim milk, cranberries  . Sandwiches:  o Hummus and grilled vegetables (peppers, zucchini, squash) on whole wheat bread   o Grilled chicken on whole wheat pita with lettuce, tomatoes, cucumbers or tzatziki  o Tuna salad on whole wheat bread: tuna salad made with greek yogurt, olives, red peppers, capers, green onions o Garlic rosemary lamb pita: lamb sauted with garlic, rosemary, salt & pepper; add lettuce, cucumber, greek yogurt to pita - flavor with lemon juice and black pepper  . Seafood:  o Mediterranean grilled salmon, seasoned with garlic, basil, parsley, lemon juice and black pepper o Shrimp, lemon, and spinach whole-grain pasta salad made with low fat greek yogurt  o Seared scallops with lemon orzo  o Seared tuna steaks seasoned salt, pepper, coriander topped with tomato mixture of olives, tomatoes, olive oil, minced garlic, parsley, green onions and cappers  . Meats:  o Herbed greek chicken salad  with kalamata olives, cucumber, feta  o Red bell peppers stuffed with spinach, bulgur, lean ground beef (or lentils) & topped with feta   o Kebabs: skewers of chicken, tomatoes, onions, zucchini, squash  o [place] burgers: made with red onions, mint, dill, lemon juice, feta cheese topped with roasted red peppers . Vegetarian o Cucumber salad: cucumbers, artichoke hearts, celery, red onion, feta cheese, tossed in olive oil & lemon juice  o Hummus and whole grain pita points with a greek salad (lettuce, tomato, feta, olives, cucumbers, red onion) o Lentil soup with celery, carrots made with vegetable broth, garlic, salt and pepper  o Tabouli salad: parsley, bulgur, mint, scallions, cucumbers, tomato, radishes, lemon juice, olive oil, salt and pepper.

## 2018-11-05 NOTE — Telephone Encounter (Signed)
11/05/2018 - PATIENT SAW RICH MORROW IN THE OFFICE TO ESTABLISH CARE ON Monday (11/05/2018). HE REQUESTED SHE RETURN IN 6 MONTHS FOR A FOLLOW-UP VISIT WITH HIM. I TRIED TO CALL AND SCHEDULE BUT HAD TO LEAVE HER A MESSAGE ON HER VOICE MAIL TO RETURN OUR CALL. [place]

## 2018-11-06 LAB — LIPID PANEL
Chol/HDL Ratio: 3.4 ratio (ref 0.0–4.4)
Cholesterol, Total: 192 mg/dL (ref 100–199)
HDL: 56 mg/dL (ref >39)
LDL Calculated: 113 mg/dL — ABNORMAL HIGH (ref 0–99)
Triglycerides: 114 mg/dL (ref 0–149)
VLDL Cholesterol Cal: 23 mg/dL (ref 5–40)

## 2018-11-06 LAB — COMPREHENSIVE METABOLIC PANEL
ALT: 19 IU/L (ref 0–32)
AST: 20 IU/L (ref 0–40)
Albumin/Globulin Ratio: 1.6 (ref 1.2–2.2)
Albumin: 4.5 g/dL (ref 3.8–4.8)
Alkaline Phosphatase: 73 IU/L (ref 39–117)
BUN/Creatinine Ratio: 10 (ref 9–23)
BUN: 8 mg/dL (ref 6–24)
Bilirubin Total: 0.7 mg/dL (ref 0.0–1.2)
CO2: 20 mmol/L (ref 20–29)
Calcium: 9.3 mg/dL (ref 8.7–10.2)
Chloride: 103 mmol/L (ref 96–106)
Creatinine, Ser: 0.84 mg/dL (ref 0.57–1.00)
GFR calc Af Amer: 97 mL/min/{1.73_m2} (ref >59)
GFR calc non Af Amer: 84 mL/min/{1.73_m2} (ref >59)
Globulin, Total: 2.8 g/dL (ref 1.5–4.5)
Glucose: 99 mg/dL (ref 65–99)
Potassium: 3.7 mmol/L (ref 3.5–5.2)
Sodium: 136 mmol/L (ref 134–144)
Total Protein: 7.3 g/dL (ref 6.0–8.5)

## 2018-11-06 LAB — TSH: TSH: 0.634 u[IU]/mL (ref 0.450–4.500)

## 2018-11-06 LAB — CBC
Hematocrit: 46.7 % — ABNORMAL HIGH (ref 34.0–46.6)
Hemoglobin: 15.4 g/dL (ref 11.1–15.9)
MCH: 32.4 pg (ref 26.6–33.0)
MCHC: 33 g/dL (ref 31.5–35.7)
MCV: 98 fL — ABNORMAL HIGH (ref 79–97)
Platelets: 251 10*3/uL (ref 150–450)
RBC: 4.76 x10E6/uL (ref 3.77–5.28)
RDW: 12.6 % (ref 11.7–15.4)
WBC: 5.6 10*3/uL (ref 3.4–10.8)

## 2018-11-06 LAB — HEMOGLOBIN A1C
Est. average glucose Bld gHb Est-mCnc: 97 mg/dL
Hgb A1c MFr Bld: 5 % (ref 4.8–5.6)

## 2018-11-06 NOTE — Progress Notes (Signed)
Benign mild elevations in HCT and MCV likely of minimal concern. Mildly elevated LDL to 113, not actionable at this time. Otherwise normal results, no concerns at this time  [name], NP

## 2018-11-12 ENCOUNTER — Other Ambulatory Visit: Payer: Self-pay | Admitting: Registered Nurse

## 2018-11-12 DIAGNOSIS — I10 Essential (primary) hypertension: Secondary | ICD-10-CM

## 2018-11-12 NOTE — Telephone Encounter (Signed)
Medication Refill - Medication: amLODipine (NORVASC) 5 MG tablet    Has the patient contacted their pharmacy? Yes.  Pt states she is needing this medication sent over to the new pharmacy. Pt states this was supposed to be sent over last Monday. Please  Advise.  (Agent: If no, request that the patient contact the pharmacy for the refill.) (Agent: If yes, when and what did the pharmacy advise?)  Preferred Pharmacy (with phone number or street name):  [facility] [place], [place] [address]  Phone: [phone] Fax: [phone]  Not a 24 hour pharmacy; exact hours not known.     Agent: Please be advised that RX refills may take up to 3 business days. We ask that you follow-up with your pharmacy.

## 2018-11-12 NOTE — Telephone Encounter (Signed)
Attempted to reach pt, no answer. Refill was sent to [facility] on [address]. Was calling pt to inform her to just have her [place] [facility]. Left vm with information.

## 2019-05-20 ENCOUNTER — Other Ambulatory Visit: Payer: Self-pay

## 2019-05-20 ENCOUNTER — Encounter: Payer: Self-pay | Admitting: Registered Nurse

## 2019-05-20 ENCOUNTER — Ambulatory Visit: Payer: BC Managed Care – PPO | Admitting: Registered Nurse

## 2019-05-20 VITALS — BP 133/91 | HR 87 | Temp 97.5°F | Ht 69.0 in | Wt 183.6 lb

## 2019-05-20 DIAGNOSIS — S46811A Strain of other muscles, fascia and tendons at shoulder and upper arm level, right arm, initial encounter: Secondary | ICD-10-CM | POA: Diagnosis not present

## 2019-05-20 MED ORDER — MELOXICAM 15 MG PO TABS: 15.0000 mg | ORAL_TABLET | Freq: Every day | ORAL | 0 refills | Status: DC

## 2019-05-20 MED ORDER — METHOCARBAMOL 500 MG PO TABS: 500.0000 mg | ORAL_TABLET | Freq: Four times a day (QID) | ORAL | 0 refills | Status: DC

## 2019-05-20 NOTE — Patient Instructions (Signed)
° ° ° °  If you have lab work done today you will be contacted with your lab results within the next 2 weeks.  If you have not heard from us then please contact us. The fastest way to get your results is to register for My Chart. ° ° °IF you received an x-ray today, you will receive an invoice from [facility]. Please contact [facility] at [phone] with questions or concerns regarding your invoice.  ° °IF you received labwork today, you will receive an invoice from [facility]. Please contact [facility] at [phone] with questions or concerns regarding your invoice.  ° °Our billing staff will not be able to assist you with questions regarding bills from these companies. ° °You will be contacted with the lab results as soon as they are available. The fastest way to get your results is to activate your My Chart account. Instructions are located on the last page of this paperwork. If you have not heard from us regarding the results in 2 weeks, please contact this office. °  ° ° ° °

## 2019-05-25 NOTE — Progress Notes (Signed)
Acute Office Visit  Subjective:    Patient ID: Toni Valencia, female    DOB: 1976/02/26, 44 y.o.   MRN: [id number]  Chief Complaint  Patient presents with  . Shoulder Pain    patient states that she has been having right shoulder pain since yesterday. per patient she has been taking tylenol    HPI Patient is in today for right shoulder pain  Onset yesterday.  Limited ROM.  Pain located between shoulder joint and neck, but not involving joint or spine. Feels muscular OTCs providing minimal relief No other areas affected Lifts frequently at work  Past Medical History:  Diagnosis Date  . Anemia   . Hypertension   . Strep throat     Past Surgical History:  Procedure Laterality Date  . ABDOMINAL HYSTERECTOMY    . APPENDECTOMY      Family History  Problem Relation Age of Onset  . Diabetes Mother     Social History   Socioeconomic History  . Marital status: Single    Spouse name: Not on file  . Number of children: 1  . Years of education: Not on file  . Highest education level: Not on file  Occupational History  . Not on file  Tobacco Use  . Smoking status: Former Smoker    Years: 1.00  . Smokeless tobacco: Never Used  Substance and Sexual Activity  . Alcohol use: Yes  . Drug use: No  . Sexual activity: Yes    Birth control/protection: None  Other Topics Concern  . Not on file  Social History Narrative  . Not on file   Social Determinants of Health   Financial Resource Strain: Low Risk   . Difficulty of Paying Living Expenses: Not hard at all  Food Insecurity: No Food Insecurity  . Worried About Charity fundraiser in the Last Year: Never true  . Ran Out of Food in the Last Year: Never true  Transportation Needs: No Transportation Needs  . Lack of Transportation (Medical): No  . Lack of Transportation (Non-Medical): No  Physical Activity: Insufficiently Active  . Days of Exercise per Week: 3 days  . Minutes of Exercise per Session: 30 min   Stress: No Stress Concern Present  . Feeling of Stress : Not at all  Social Connections: Unknown  . Frequency of Communication with Friends and Family: Three times a week  . Frequency of Social Gatherings with Friends and Family: Twice a week  . Attends Religious Services: Patient refused  . Active Member of Clubs or Organizations: Patient refused  . Attends Archivist Meetings: Patient refused  . Marital Status: Patient refused  Intimate Partner Violence: Not At Risk  . Fear of Current or Ex-Partner: No  . Emotionally Abused: No  . Physically Abused: No  . Sexually Abused: No    Outpatient Medications Prior to Visit  Medication Sig Dispense Refill  . amLODipine (NORVASC) 5 MG tablet Take 1 tablet (5 mg total) by mouth 2 (two) times daily. 180 tablet 1  . Blood Pressure Monitoring (SPHYGMOMANOMETER) MISC 1 Units by Does not apply route once a week. (Patient not taking: Reported on 05/20/2019) 1 each 0  . ibuprofen (ADVIL,MOTRIN) 200 MG tablet Take 400 mg by mouth every 6 (six) hours as needed for headache, mild pain or moderate pain.     No facility-administered medications prior to visit.    Allergies  Allergen Reactions  . Lisinopril Swelling    Caused pt lip to  swell     Review of Systems  Constitutional: Negative.   HENT: Negative.   Eyes: Negative.   Respiratory: Negative.   Cardiovascular: Negative.   Gastrointestinal: Negative.   Endocrine: Negative.   Genitourinary: Negative.   Musculoskeletal: Positive for arthralgias, myalgias (r trapezius) and neck stiffness. Negative for back pain, gait problem, joint swelling and neck pain.  Skin: Negative.   Allergic/Immunologic: Negative.   Neurological: Negative.   Hematological: Negative.   Psychiatric/Behavioral: Negative.   All other systems reviewed and are negative.      Objective:    Physical Exam Vitals and nursing note reviewed.  Constitutional:      General: She is not in acute distress.     Appearance: Normal appearance. She is normal weight. She is not ill-appearing, toxic-appearing or diaphoretic.  Cardiovascular:     Rate and Rhythm: Normal rate and regular rhythm.  Pulmonary:     Effort: Pulmonary effort is normal. No respiratory distress.  Musculoskeletal:        General: Tenderness (r trapezius) present. No swelling, deformity or signs of injury.     Right lower leg: No edema.     Left lower leg: No edema.     Comments: rom limited through all motions of r shoulder  Skin:    General: Skin is warm and dry.     Capillary Refill: Capillary refill takes less than 2 seconds.     Coloration: Skin is not jaundiced or pale.     Findings: No bruising, erythema, lesion or rash.  Neurological:     General: No focal deficit present.     Mental Status: She is alert and oriented to person, place, and time. Mental status is at baseline.     Sensory: No sensory deficit.     Motor: Weakness present.  Psychiatric:        Mood and Affect: Mood normal.        Behavior: Behavior normal.        Thought Content: Thought content normal.        Judgment: Judgment normal.     BP (!) 133/91   Pulse 87   Temp (!) 97.5 F (36.4 C) (Temporal)   Ht 5\' 9"  (1.753 m)   Wt 183 lb 9.6 oz (83.3 kg)   LMP 05/04/2015 Comment: light bleeding for a month, became heavy today  SpO2 100%   BMI 27.11 kg/m  Wt Readings from Last 3 Encounters:  05/20/19 183 lb 9.6 oz (83.3 kg)  11/05/18 186 lb (84.4 kg)  06/04/18 196 lb (88.9 kg)    Health Maintenance Due  Topic Date Due  . PAP SMEAR-Modifier  02/26/2018    There are no preventive care reminders to display for this patient.   Lab Results  Component Value Date   TSH 0.634 11/05/2018   Lab Results  Component Value Date   WBC 5.6 11/05/2018   HGB 15.4 11/05/2018   HCT 46.7 (H) 11/05/2018   MCV 98 (H) 11/05/2018   PLT 251 11/05/2018   Lab Results  Component Value Date   NA 136 11/05/2018   K 3.7 11/05/2018   CO2 20 11/05/2018    GLUCOSE 99 11/05/2018   BUN 8 11/05/2018   CREATININE 0.84 11/05/2018   BILITOT 0.7 11/05/2018   ALKPHOS 73 11/05/2018   AST 20 11/05/2018   ALT 19 11/05/2018   PROT 7.3 11/05/2018   ALBUMIN 4.5 11/05/2018   CALCIUM 9.3 11/05/2018   ANIONGAP 10 02/20/2015  Lab Results  Component Value Date   CHOL 192 11/05/2018   Lab Results  Component Value Date   HDL 56 11/05/2018   Lab Results  Component Value Date   LDLCALC 113 (H) 11/05/2018   Lab Results  Component Value Date   TRIG 114 11/05/2018   Lab Results  Component Value Date   CHOLHDL 3.4 11/05/2018   Lab Results  Component Value Date   HGBA1C 5.0 11/05/2018       Assessment & Plan:   Problem List Items Addressed This Visit    None    Visit Diagnoses    Strain of right trapezius muscle, initial encounter    -  Primary   Relevant Medications   meloxicam (MOBIC) 15 MG tablet   methocarbamol (ROBAXIN) 500 MG tablet       Meds ordered this encounter  Medications  . meloxicam (MOBIC) 15 MG tablet    Sig: Take 1 tablet (15 mg total) by mouth daily.    Dispense:  30 tablet    Refill:  0    Order Specific Question:   Supervising Provider    Answer:   Delia Chimes A [id number]  . methocarbamol (ROBAXIN) 500 MG tablet    Sig: Take 1 tablet (500 mg total) by mouth 4 (four) times daily.    Dispense:  60 tablet    Refill:  0    Order Specific Question:   Supervising Provider    Answer:   Forrest Moron [id number]   PLAN  R trapezius strain  Rest, ice, compress, heat  meloxicam and robaxin  Pt agrees to plan  Patient encouraged to call clinic with any questions, comments, or concerns.  Maximiano Coss, NP

## 2019-10-23 ENCOUNTER — Other Ambulatory Visit: Payer: Self-pay | Admitting: Registered Nurse

## 2019-10-23 DIAGNOSIS — I10 Essential (primary) hypertension: Secondary | ICD-10-CM

## 2020-02-14 DIAGNOSIS — Z20822 Contact with and (suspected) exposure to covid-19: Secondary | ICD-10-CM | POA: Diagnosis not present

## 2020-06-16 ENCOUNTER — Ambulatory Visit: Payer: Self-pay

## 2020-06-16 ENCOUNTER — Other Ambulatory Visit: Payer: Self-pay | Admitting: Family Medicine

## 2020-06-16 ENCOUNTER — Other Ambulatory Visit: Payer: Self-pay

## 2020-06-16 DIAGNOSIS — M542 Cervicalgia: Secondary | ICD-10-CM

## 2020-06-26 ENCOUNTER — Other Ambulatory Visit: Payer: Self-pay | Admitting: Registered Nurse

## 2020-06-26 DIAGNOSIS — I10 Essential (primary) hypertension: Secondary | ICD-10-CM

## 2020-12-11 ENCOUNTER — Ambulatory Visit (HOSPITAL_COMMUNITY)
Admission: EM | Admit: 2020-12-11 | Discharge: 2020-12-11 | Disposition: A | Discharge status: Home / Self Care | Payer: Self-pay | Attending: Emergency Medicine | Admitting: Emergency Medicine

## 2020-12-11 ENCOUNTER — Encounter (HOSPITAL_COMMUNITY): Payer: Self-pay

## 2020-12-11 ENCOUNTER — Other Ambulatory Visit: Payer: Self-pay

## 2020-12-11 DIAGNOSIS — R600 Localized edema: Secondary | ICD-10-CM

## 2020-12-11 DIAGNOSIS — I10 Essential (primary) hypertension: Secondary | ICD-10-CM

## 2020-12-11 MED ORDER — AMLODIPINE BESYLATE 5 MG PO TABS: 5.0000 mg | ORAL_TABLET | Freq: Every day | ORAL | 2 refills | Status: DC

## 2020-12-11 MED ORDER — SPHYGMOMANOMETER MISC: 1.0000 [IU] | Freq: Every morning | 0 refills | Status: AC

## 2020-12-11 MED ORDER — HYDROCHLOROTHIAZIDE 25 MG PO TABS: 25.0000 mg | ORAL_TABLET | Freq: Every morning | ORAL | 2 refills | Status: DC

## 2020-12-11 NOTE — Discharge Instructions (Addendum)
Please begin taking hydrochlorothiazide every morning with your dose of amlodipine 5 mg.  We will assist you with finding a new primary care provider, you should be contacted within the next few days.  I have provided you with a prescription for any blood pressure cuff.  If you begin to experience acute shortness of breath, worst headache of your life or do not notice that your lower extremity swelling improves or it begins to worsen, please go to the emergency room for acute evaluation.

## 2020-12-11 NOTE — ED Provider Notes (Signed)
Toni Valencia    CSN: [id number] Arrival date & time: 12/11/20  [id number]      History   Chief Complaint Chief Complaint  Patient presents with   Hypertension    HPI Toni Valencia is a 45 y.o. female.   Patient is here for follow-up of her high blood pressure.  Patient states it was previously well controlled until her primary care provider left the practice where she was going.  States she has been unable to obtain a appointment with a new provider since then.  Patient states she is been taking amlodipine 5 mg daily, had to call her her pharmacist to request special refill, but states that some days she feels like it just is not enough.  Patient states she went to [facility] to days go and checked her blood pressure was 148/103.  Patient states she has not had any chest pain, headache, dry cough however she has just felt "off".  Patient states she was sent home from work yesterday due to high blood pressure.  Patient states she has noticed she has had increased swelling in her lower extremities towards the end of the day lately, states she does eat out often but does not eat pork.  The history is provided by the patient.  Hypertension   Past Medical History:  Diagnosis Date   Anemia    Hypertension    Strep throat     There are no problems to display for this patient.   Past Surgical History:  Procedure Laterality Date   ABDOMINAL HYSTERECTOMY     APPENDECTOMY      OB History     Gravida  1   Para  1   Term  1   Preterm      AB      Living  1      SAB      IAB      Ectopic      Multiple      Live Births               Home Medications    Prior to Admission medications   Medication Sig Start Date End Date Taking? Authorizing Provider  hydrochlorothiazide (HYDRODIURIL) 25 MG tablet Take 1 tablet (25 mg total) by mouth in the morning. 12/11/20 01/10/21 Yes Lynden Oxford Scales, PA-C  amLODipine (NORVASC) 5 MG tablet Take 1 tablet (5 mg  total) by mouth daily. 12/11/20 01/10/21  Lynden Oxford Scales, PA-C  Blood Pressure Monitoring [facility]) MISC 1 Units by Does not apply route in the morning. 12/11/20   Lynden Oxford Scales, PA-C  ibuprofen (ADVIL,MOTRIN) 200 MG tablet Take 400 mg by mouth every 6 (six) hours as needed for headache, mild pain or moderate pain.    [provider]  meloxicam (MOBIC) 15 MG tablet Take 1 tablet (15 mg total) by mouth daily. 05/20/19   Maximiano Coss, NP  methocarbamol (ROBAXIN) 500 MG tablet Take 1 tablet (500 mg total) by mouth 4 (four) times daily. 05/20/19   Maximiano Coss, NP    Family History Family History  Problem Relation Age of Onset   Diabetes Mother     Social History Social History   Tobacco Use   Smoking status: Former    Years: 1.00    Types: Cigarettes   Smokeless tobacco: Never  Vaping Use   Vaping Use: Never used  Substance Use Topics   Alcohol use: Yes   Drug use: No  Allergies   Lisinopril   Review of Systems Review of Systems Per HPI  Physical Exam Triage Vital Signs ED Triage Vitals  Enc Vitals Group     BP 12/11/20 0926 (!) 144/102     Pulse Rate 12/11/20 0926 82     Resp 12/11/20 0926 18     Temp 12/11/20 0926 98.5 F (36.9 C)     Temp Source 12/11/20 0926 Oral     SpO2 12/11/20 0926 100 %     Weight --      Height --      Head Circumference --      Peak Flow --      Pain Score 12/11/20 0924 0     Pain Loc --      Pain Edu? --      Excl. in [place]? --    No data found.  Updated Vital Signs BP (!) 144/102 (BP Location: Left Arm)   Pulse 82   Temp 98.5 F (36.9 C) (Oral)   Resp 18   LMP 05/04/2015 Comment: light bleeding for a month, became heavy today  SpO2 100%   Visual Acuity Right Eye Distance:   Left Eye Distance:   Bilateral Distance:    Right Eye Near:   Left Eye Near:    Bilateral Near:     Physical Exam Constitutional:      Appearance: Normal appearance.  HENT:     Head: Normocephalic and  atraumatic.     Right Ear: Tympanic membrane, ear canal and external ear normal.     Left Ear: Tympanic membrane, ear canal and external ear normal.     Nose: Nose normal.     Mouth/Throat:     Mouth: Mucous membranes are moist.  Eyes:     Conjunctiva/sclera: Conjunctivae normal.  Cardiovascular:     Rate and Rhythm: Normal rate and regular rhythm.     Pulses: Normal pulses.     Heart sounds: Normal heart sounds.  Pulmonary:     Effort: Pulmonary effort is normal.     Breath sounds: Normal breath sounds. No wheezing, rhonchi or rales.  Abdominal:     General: Abdomen is flat.     Palpations: Abdomen is soft.  Musculoskeletal:     Cervical back: Normal range of motion and neck supple.  Skin:    General: Skin is warm and dry.  Neurological:     General: No focal deficit present.     Mental Status: She is alert and oriented to person, place, and time.  Psychiatric:        Mood and Affect: Mood normal.        Behavior: Behavior normal.     UC Treatments / Results  Labs (all labs ordered are listed, but only abnormal results are displayed) Labs Reviewed - No data to display  EKG   Radiology No results found.  Procedures Procedures (including critical care time)  Medications Ordered in UC Medications - No data to display  Initial Impression / Assessment and Plan / UC Course  I have reviewed the triage vital signs and the nursing notes.  Pertinent labs & imaging results that were available during my care of the patient were reviewed by me and considered in my medical decision making (see chart for details).     Patient states she is never been prescribed a diuretic to treat her high blood pressure.  I will begin that now.  I agree that she should continue amlodipine as  it is very effective in African-American population.  Patient advised that initially she may notice an increased urine output however this should resolve over a few weeks.  We will assist patient in  finding a new primary care provider.  Recommend she follow-up with that provider within the next month.  Patient has been cautioned to present to the emergency room should she experience intense refractory headache, chest pain, shortness of breath, lower extremity swelling thiat does not resolve with diuretics. Final Clinical Impressions(s) / UC Diagnoses   Final diagnoses:  Essential hypertension  Bilateral lower extremity edema     Discharge Instructions      Please begin taking hydrochlorothiazide every morning with your dose of amlodipine 5 mg.  We will assist you with finding a new primary care provider, you should be contacted within the next few days.  I have provided you with a prescription for any blood pressure cuff.  If you begin to experience acute shortness of breath, worst headache of your life or do not notice that your lower extremity swelling improves or it begins to worsen, please go to the emergency room for acute evaluation.     ED Prescriptions     Medication Sig Dispense Auth. Provider   hydrochlorothiazide (HYDRODIURIL) 25 MG tablet Take 1 tablet (25 mg total) by mouth in the morning. 30 tablet Lynden Oxford Scales, PA-C   amLODipine (NORVASC) 5 MG tablet Take 1 tablet (5 mg total) by mouth daily. 30 tablet Lynden Oxford Scales, PA-C   Blood Pressure Monitoring (SPHYGMOMANOMETER) MISC 1 Units by Does not apply route in the morning. 1 each Lynden Oxford Scales, PA-C      PDMP not reviewed this encounter.   Lynden Oxford Scales, PA-C 12/11/20 [phone]

## 2020-12-11 NOTE — ED Triage Notes (Signed)
Pt presents with elevated blood pressure X 3 days.

## 2022-03-17 DIAGNOSIS — M62830 Muscle spasm of back: Secondary | ICD-10-CM | POA: Diagnosis not present

## 2022-06-10 IMAGING — DX DG CERVICAL SPINE COMPLETE 4+V
6 series · 6 of 6 positions shown · non-contrast
Comparison: None.

CLINICAL DATA: Neck pain after fall.

EXAM:
CERVICAL SPINE - COMPLETE 4+ VIEW

[c-spine lat]
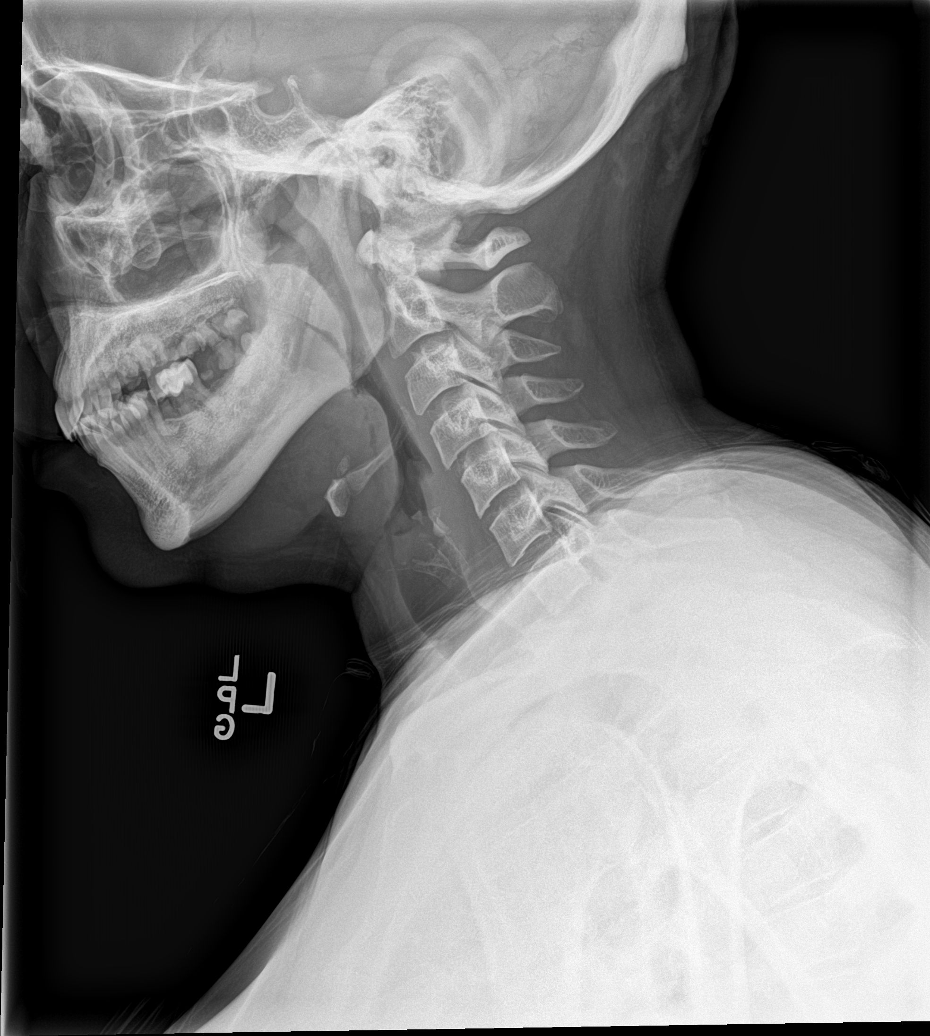

[c-spine obl (1 of 2)]
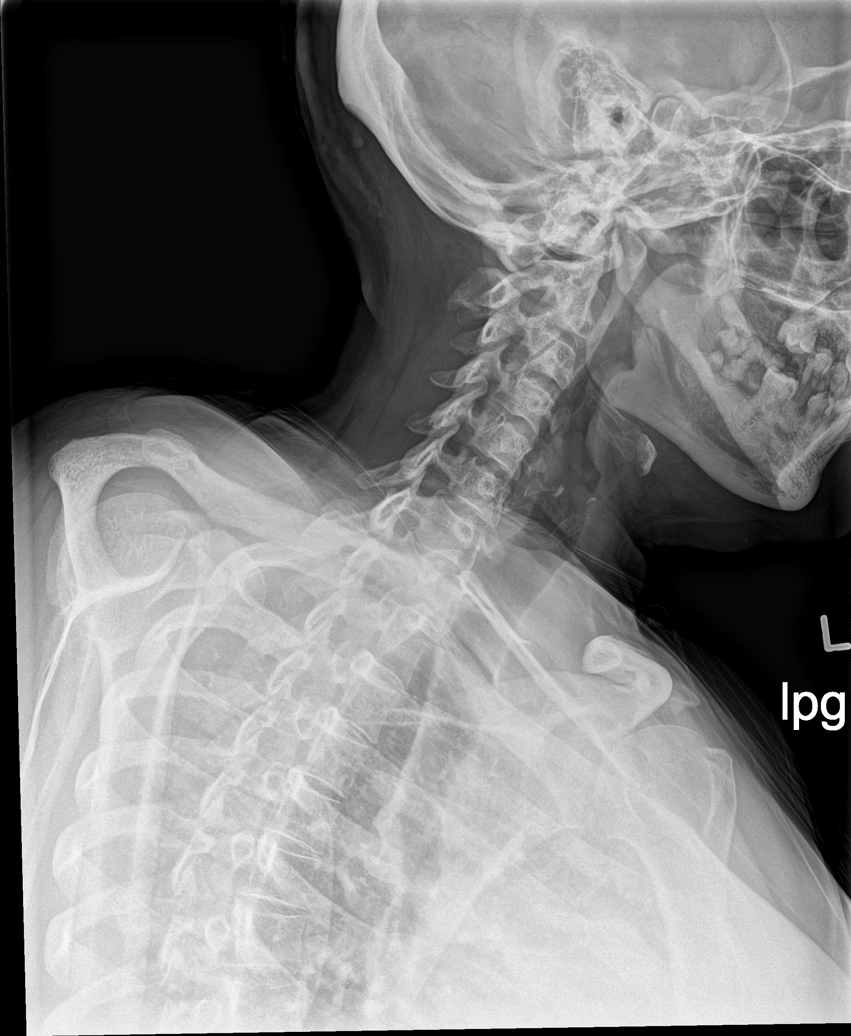

[c-spine obl (2 of 2)]
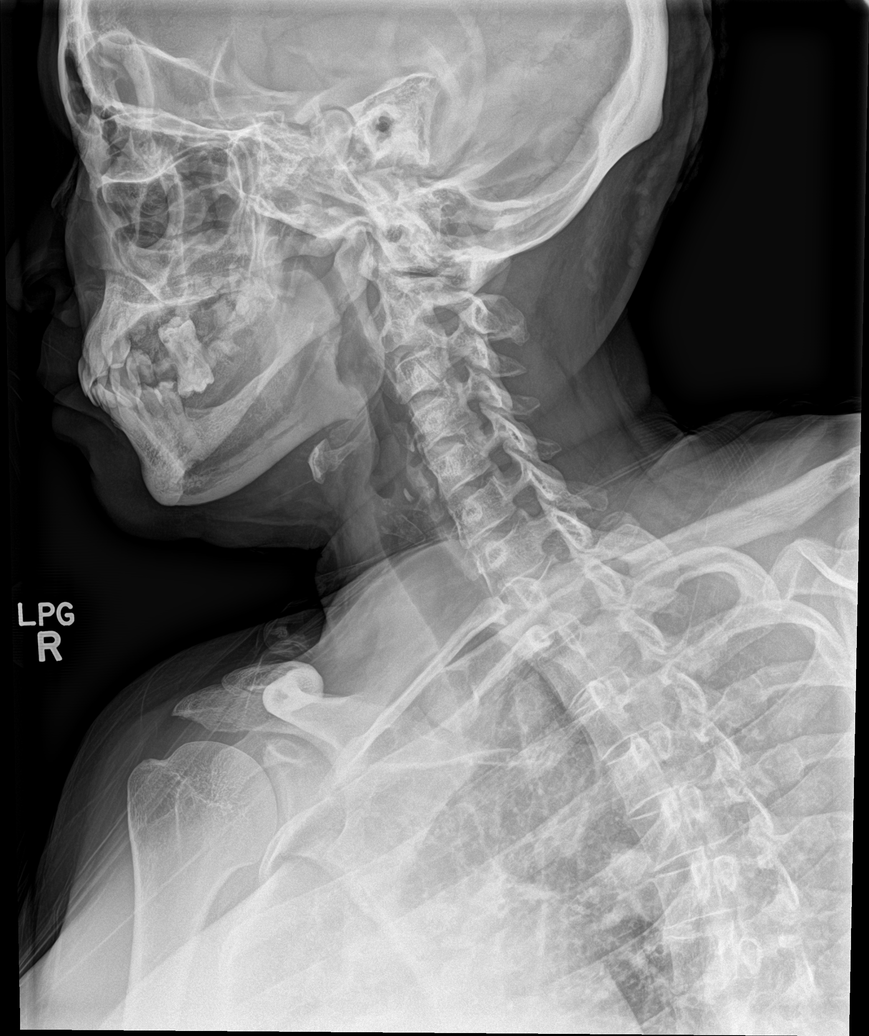

[c-spine ap]
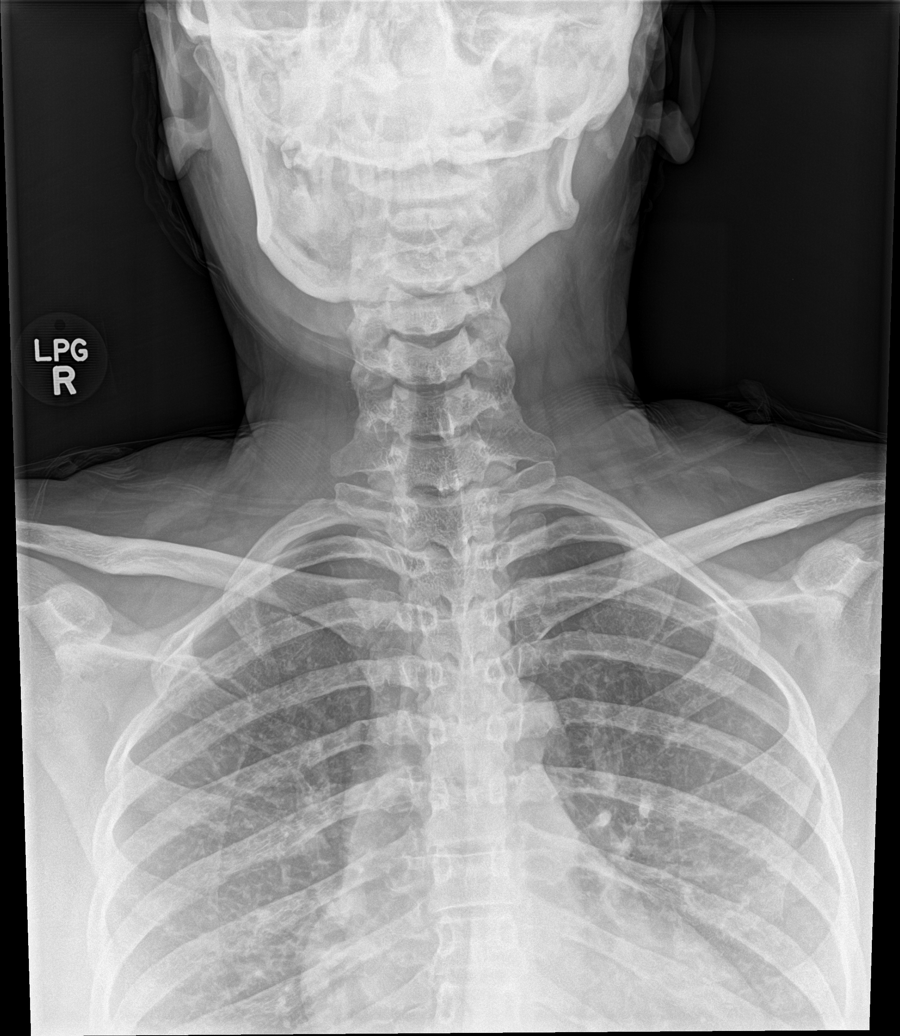

[c-spine open mouth]
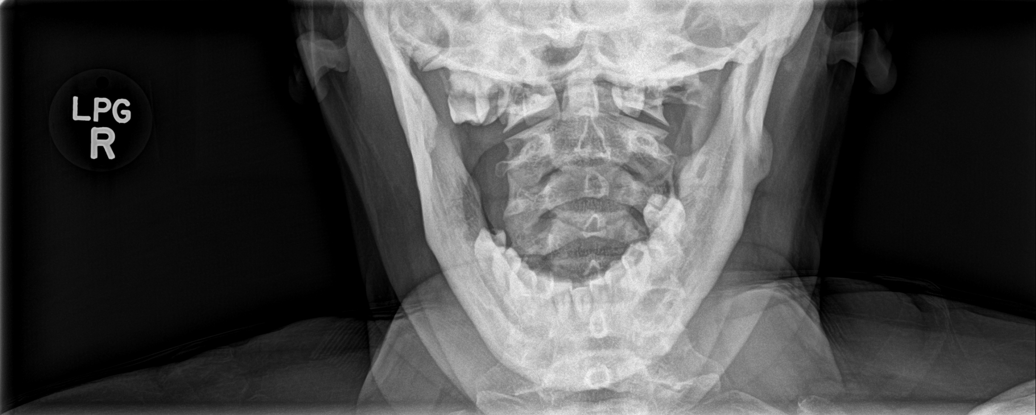

[c-spine swimmers]
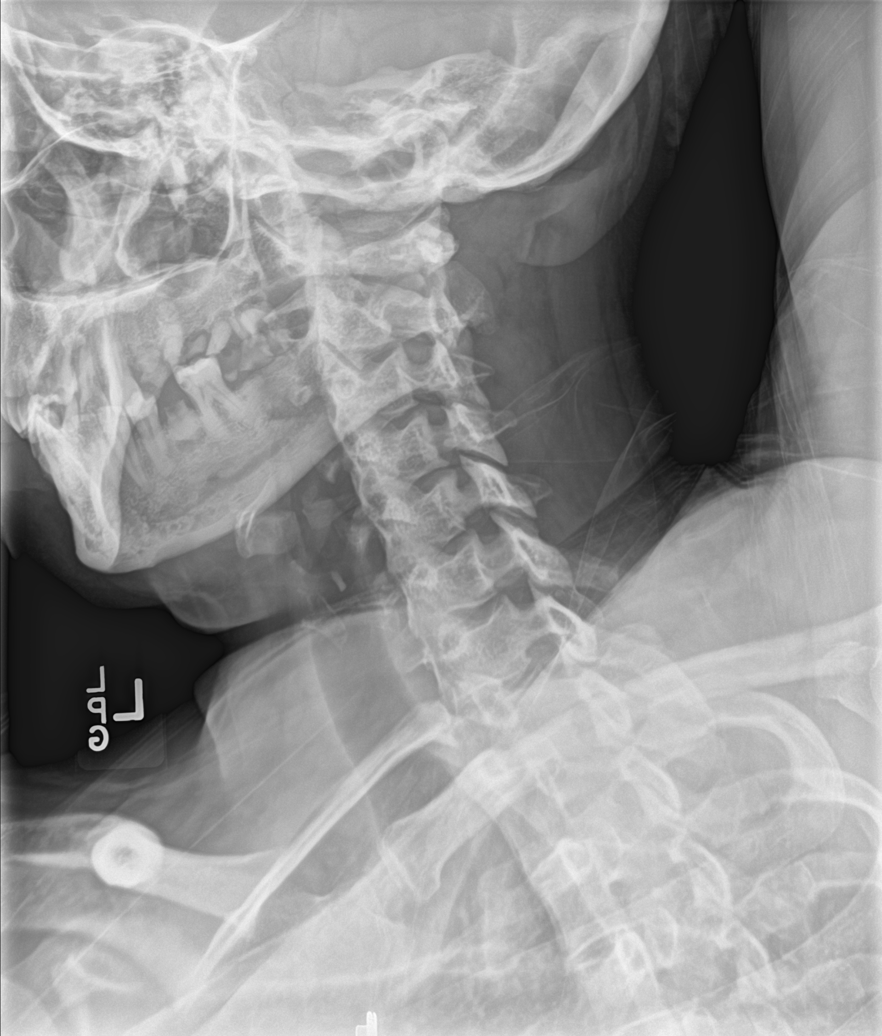

[6 of 6 positions shown; findings below may reference images not displayed]

FINDINGS: There is no evidence of cervical spine fracture or prevertebral soft
tissue swelling. Alignment is normal. Disc spaces are
well-maintained. Mild bilateral neural foraminal stenosis is noted
at C4-5 secondary to uncovertebral spurring.
IMPRESSION: Mild bilateral neural foraminal stenosis is noted at C4-5 secondary
to uncovertebral spurring. No acute abnormality seen in the cervical
spine.

## 2023-12-20 ENCOUNTER — Ambulatory Visit (HOSPITAL_COMMUNITY): Admission: EM | Admit: 2023-12-20 | Discharge: 2023-12-20 | Disposition: A | Discharge status: Home / Self Care

## 2023-12-20 ENCOUNTER — Encounter (HOSPITAL_COMMUNITY): Payer: Self-pay

## 2023-12-20 DIAGNOSIS — R42 Dizziness and giddiness: Secondary | ICD-10-CM | POA: Diagnosis not present

## 2023-12-20 NOTE — ED Provider Notes (Signed)
 [facility]    CSN: [id number] Arrival date & time: 12/20/23  [id number]      History   Chief Complaint Chief Complaint  Patient presents with   Dizziness    HPI TENIYAH Valencia is a 48 y.o. female.   Patient here for evaluation of dizziness x this morning. She has history of HTN, HLD.  She did not take her BP medication yet, and she has not yet had anything to eat.  This is not abnormal for her, she rarely eats breakfast. 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  Reports blurry vision, presyncopal.  Denies headache, chest pain, palpitations, LE edema, SOB, cough, wheezing, n/v, n/t, weakness.  She reports similar sx happened ~ 3 months ago.    Past Medical History:  Diagnosis Date   Anemia    Hypertension    Strep throat     There are no active problems to display for this patient.   Past Surgical History:  Procedure Laterality Date   ABDOMINAL HYSTERECTOMY     APPENDECTOMY      OB History     Gravida  1   Para  1   Term  1   Preterm      AB      Living  1      SAB      IAB      Ectopic      Multiple      Live Births               Home Medications    Prior to Admission medications   Medication Sig Start Date End Date Taking? Authorizing Provider  amLODipine  (NORVASC ) 10 MG tablet Take 10 mg by mouth. 11/15/23  Yes [provider]  Blood Pressure Monitoring (SPHYGMOMANOMETER) MISC 1 Units by Does not apply route in the morning. 12/11/20   Joesph Shaver Scales, PA-C    Family History Family History  Problem Relation Age of Onset   Diabetes Mother     Social History Social History   Tobacco Use   Smoking status: Every Day    Types: Cigarettes   Smokeless tobacco: Never  Vaping Use   Vaping status: Never Used  Substance Use Topics   Alcohol use: Yes   Drug use: No     Allergies   Lisinopril    Review of Systems Review of Systems   Constitutional:  Negative for chills, fatigue and fever.  HENT:  Negative for congestion, ear pain, nosebleeds, postnasal drip, rhinorrhea, sinus pressure, sinus pain and sore throat.   Eyes:  Positive for visual disturbance. Negative for photophobia, pain and redness.  Respiratory:  Negative for cough, chest tightness, shortness of breath and wheezing.   Cardiovascular:  Negative for chest pain, palpitations and leg swelling.  Gastrointestinal:  Negative for abdominal pain, diarrhea, nausea and vomiting.  Musculoskeletal:  Negative for arthralgias and myalgias.  Skin:  Negative for color change, rash and wound.  Neurological:  Positive for light-headedness. Negative for dizziness, syncope, speech difficulty, weakness, numbness and headaches.  Hematological:  Negative for adenopathy. Does not bruise/bleed easily.  Psychiatric/Behavioral:  Negative for confusion and sleep disturbance.      Physical Exam Triage Vital Signs ED Triage Vitals  Encounter Vitals Group     BP 12/20/23 0911 (!) 144/97     Girls Systolic BP Percentile --      Girls Diastolic BP Percentile --  Boys Systolic BP Percentile --      Boys Diastolic BP Percentile --      Pulse Rate 12/20/23 0911 85     Resp 12/20/23 0911 16     Temp 12/20/23 0911 97.8 F (36.6 C)     Temp Source 12/20/23 0911 Oral     SpO2 12/20/23 0911 97 %     Weight --      Height --      Head Circumference --      Peak Flow --      Pain Score 12/20/23 0912 0     Pain Loc --      Pain Education --      Exclude from Growth Chart --    No data found.  Updated Vital Signs BP (!) 129/90 (BP Location: Right Arm)   Pulse 85   Temp 97.8 F (36.6 C) (Oral)   Resp 16   LMP 05/04/2015 Comment: light bleeding for a month, became heavy today  SpO2 97%   Visual Acuity Right Eye Distance:   Left Eye Distance:   Bilateral Distance:    Right Eye Near:   Left Eye Near:    Bilateral Near:     Physical Exam Vitals and nursing note  reviewed.  Constitutional:      General: She is not in acute distress.    Appearance: Normal appearance. She is not ill-appearing.  HENT:     Head: Normocephalic and atraumatic.     Right Ear: Tympanic membrane and ear canal normal.     Left Ear: Tympanic membrane and ear canal normal.     Nose: No congestion or rhinorrhea.     Right Sinus: No maxillary sinus tenderness or frontal sinus tenderness.     Left Sinus: No maxillary sinus tenderness or frontal sinus tenderness.     Mouth/Throat:     Pharynx: No oropharyngeal exudate or posterior oropharyngeal erythema.  Eyes:     General: No scleral icterus.    Extraocular Movements: Extraocular movements intact.     Conjunctiva/sclera: Conjunctivae normal.  Cardiovascular:     Rate and Rhythm: Normal rate and regular rhythm.     Heart sounds: No murmur heard. Pulmonary:     Effort: Pulmonary effort is normal. No respiratory distress.     Breath sounds: No wheezing, rhonchi or rales.  Abdominal:     General: There is no distension.     Tenderness: There is no abdominal tenderness.  Musculoskeletal:        General: Normal range of motion.     Cervical back: Normal range of motion. No rigidity.  Lymphadenopathy:     Cervical: No cervical adenopathy.  Skin:    General: Skin is warm.     Coloration: Skin is not jaundiced.     Findings: No rash.  Neurological:     General: No focal deficit present.     Mental Status: She is alert and oriented to person, place, and time.     GCS: GCS eye subscore is 4. GCS verbal subscore is 5. GCS motor subscore is 6.     Cranial Nerves: Cranial nerves 2-12 are intact. No cranial nerve deficit or facial asymmetry.     Motor: No weakness, tremor or abnormal muscle tone.     Coordination: Coordination normal.     Gait: Gait and tandem walk normal.     Deep Tendon Reflexes:     Reflex Scores:      Patellar reflexes are  2+ on the right side and 2+ on the left side. Psychiatric:        Mood and Affect:  Mood normal.        Behavior: Behavior normal.      UC Treatments / Results  Labs (all labs ordered are listed, but only abnormal results are displayed) Labs Reviewed - No data to display  EKG NSR, 79 BPM  Radiology No results found.  Procedures Procedures (including critical care time)  Medications Ordered in UC Medications - No data to display  Initial Impression / Assessment and Plan / UC Course  I have reviewed the triage vital signs and the nursing notes.  Pertinent labs & imaging results that were available during my care of the patient were reviewed by me and considered in my medical decision making (see chart for details).     VSS No focal neurological deficit ECG without any acute abnormalities Strict ED precautions provided Patient aware cannot definitively rule out CVA, cardiac conditions, other causes of dizziness, hwoever, I feel those are unlikely.  Advised her to ear regular meals, follow up with PCP  Final Clinical Impressions(s) / UC Diagnoses   Final diagnoses:  Dizziness     Discharge Instructions      Go to ED if symptoms return Follow up with PCP Get classes checked Ear regular meals Take medication as prescribed     ED Prescriptions   None    PDMP not reviewed this encounter.   Juleen Rush, PA-C 12/20/23 1012

## 2023-12-20 NOTE — Discharge Instructions (Addendum)
 Go to ED if symptoms return Follow up with PCP Get classes checked Ear regular meals Take medication as prescribed

## 2023-12-20 NOTE — ED Triage Notes (Signed)
 Patient here today with c/o dizziness and weakness X 1 day. Denies chest pain. Denies Syncope. Patient states that she has a h/o HTN.
